# Patient Record
Sex: Male | Born: 1995 | Race: White | Hispanic: No | Marital: Single | Smoking: Current every day smoker
Health system: Southern US, Community
[De-identification: ages and names within clinical notes are randomized; demographics above are authoritative.]

## PROBLEM LIST (undated history)

## (undated) DIAGNOSIS — F1910 Other psychoactive substance abuse, uncomplicated: Secondary | ICD-10-CM

## (undated) DIAGNOSIS — I10 Essential (primary) hypertension: Secondary | ICD-10-CM

## (undated) DIAGNOSIS — J3489 Other specified disorders of nose and nasal sinuses: Secondary | ICD-10-CM

## (undated) DIAGNOSIS — F909 Attention-deficit hyperactivity disorder, unspecified type: Secondary | ICD-10-CM

## (undated) HISTORY — DX: Essential (primary) hypertension: I10

## (undated) HISTORY — DX: Attention-deficit hyperactivity disorder, unspecified type: F90.9

## (undated) HISTORY — DX: Other specified disorders of nose and nasal sinuses: J34.89

## (undated) HISTORY — DX: Other psychoactive substance abuse, uncomplicated: F19.10

---
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
## 2018-01-30 ENCOUNTER — Encounter (HOSPITAL_COMMUNITY): Payer: Self-pay | Admitting: Emergency Medicine

## 2018-01-30 ENCOUNTER — Emergency Department (HOSPITAL_COMMUNITY)
Admission: EM | Admit: 2018-01-30 | Discharge: 2018-01-30 | Disposition: A | Discharge status: Home / Self Care | Payer: BLUE CROSS/BLUE SHIELD | Attending: Emergency Medicine | Admitting: Emergency Medicine

## 2018-01-30 ENCOUNTER — Other Ambulatory Visit: Payer: Self-pay

## 2018-01-30 DIAGNOSIS — R202 Paresthesia of skin: Secondary | ICD-10-CM | POA: Insufficient documentation

## 2018-01-30 DIAGNOSIS — Z5321 Procedure and treatment not carried out due to patient leaving prior to being seen by health care provider: Secondary | ICD-10-CM | POA: Insufficient documentation

## 2018-01-30 NOTE — ED Notes (Signed)
Pt has come out of his room saying,"I know what's wrong, I have heat stroke" also said he is feeling better and no longer needs to be here and said he'd like to go.

## 2018-01-30 NOTE — ED Triage Notes (Signed)
Pt reports that several days ago he was drinking alcohol and the next day mowed the grass and then felt his fingers tingling and wanted to be check out. Pt alert and oriented x 4 and reports to drinking fluids without difficulty.

## 2018-04-28 ENCOUNTER — Emergency Department (HOSPITAL_COMMUNITY)
Admission: EM | Admit: 2018-04-28 | Discharge: 2018-04-28 | Disposition: A | Discharge status: Home / Self Care | Payer: BLUE CROSS/BLUE SHIELD | Attending: Emergency Medicine | Admitting: Emergency Medicine

## 2018-04-28 ENCOUNTER — Encounter (HOSPITAL_COMMUNITY): Payer: Self-pay | Admitting: *Deleted

## 2018-04-28 ENCOUNTER — Other Ambulatory Visit: Payer: Self-pay

## 2018-04-28 DIAGNOSIS — Y929 Unspecified place or not applicable: Secondary | ICD-10-CM | POA: Diagnosis not present

## 2018-04-28 DIAGNOSIS — Y999 Unspecified external cause status: Secondary | ICD-10-CM | POA: Insufficient documentation

## 2018-04-28 DIAGNOSIS — W25XXXA Contact with sharp glass, initial encounter: Secondary | ICD-10-CM | POA: Insufficient documentation

## 2018-04-28 DIAGNOSIS — S61511A Laceration without foreign body of right wrist, initial encounter: Secondary | ICD-10-CM | POA: Diagnosis not present

## 2018-04-28 DIAGNOSIS — Y939 Activity, unspecified: Secondary | ICD-10-CM | POA: Insufficient documentation

## 2018-04-28 MED ORDER — LIDOCAINE-EPINEPHRINE (PF) 2 %-1:200000 IJ SOLN
INTRAMUSCULAR | Status: AC
  Filled 2018-04-28: qty 20

## 2018-04-28 MED ORDER — LIDOCAINE-EPINEPHRINE (PF) 2 %-1:200000 IJ SOLN
20.0000 mL | Freq: Once | INTRAMUSCULAR | Status: AC
  Administered 2018-04-28: 20 mL

## 2018-04-28 NOTE — ED Triage Notes (Signed)
Pt reports he was trying to cut into a mason jar and the [employer]ason jar broke. Laceration to the anterior right wrist. Bleeding controlled in triage. Pt able to move wrist without difficulty

## 2018-04-28 NOTE — ED Provider Notes (Signed)
[facility]-EMERGENCY DEPT Provider Note   CSN: [id number] Arrival date & time: 04/28/18  0211     History   Chief Complaint Chief Complaint  Patient presents with  . Laceration    HPI Kyle Webb is a 22 y.o. male.  Patient presents to the emergency department with a chief complaint of right wrist laceration.  He states that he was trying to cut a mason jar and it broke and cut his wrist.  He denies any numbness or tingling in his fingertips.  Denies any weakness.  Last tetanus shot was 3 years ago.  He has not taken anything for his symptoms.  The history is provided by the patient. No language interpreter was used.    Past Medical History:  Diagnosis Date  . ADHD (attention deficit hyperactivity disorder)   . Sinus pressure     Patient Active Problem List   Diagnosis Date Noted  . ADHD (attention deficit hyperactivity disorder) 06/19/2012    History reviewed. No pertinent surgical history.      Home Medications    Prior to Admission medications   Medication Sig Start Date End Date Taking? Authorizing Provider  amphetamine-dextroamphetamine (ADDERALL XR) 30 MG 24 hr capsule Take 1 capsule (30 mg total) by mouth daily with breakfast. Patient not taking: Reported on 12/13/2016 03/14/15   Georgiann Hahnamgoolam, Andres, MD    Family History Family History  Problem Relation Age of Onset  . Hypertension Maternal Grandfather   . Hypertension Paternal Grandfather   . Alcohol abuse Neg Hx   . Arthritis Neg Hx   . Asthma Neg Hx   . Birth defects Neg Hx   . Cancer Neg Hx   . COPD Neg Hx   . Depression Neg Hx   . Diabetes Neg Hx   . Drug abuse Neg Hx   . Early death Neg Hx   . Hearing loss Neg Hx   . Heart disease Neg Hx   . Hyperlipidemia Neg Hx   . Kidney disease Neg Hx   . Learning disabilities Neg Hx   . Mental illness Neg Hx   . Mental retardation Neg Hx   . Miscarriages / Stillbirths Neg Hx   . Stroke Neg Hx   . Vision loss Neg Hx   .  Varicose Veins Neg Hx     Social History Social History   Tobacco Use  . Smoking status: Current Some Day Smoker  . Smokeless tobacco: Never Used  Substance Use Topics  . Alcohol use: Yes    Alcohol/week: 0.0 standard drinks    Comment: 12 pack of beer  . Drug use: Yes    Types: Marijuana     Allergies   Patient has no known allergies.   Review of Systems Review of Systems  All other systems reviewed and are negative.    Physical Exam Updated Vital Signs BP 132/80 (BP Location: Left Arm)   Pulse 93   Temp 98 F (36.7 C) (Oral)   Resp 18   Ht 5\' 8"  (1.727 m)   Wt 76.2 kg   SpO2 98%   BMI 25.54 kg/m   Physical Exam  Constitutional: He is oriented to person, place, and time. No distress.  HENT:  Head: Normocephalic and atraumatic.  Eyes: Pupils are equal, round, and reactive to light. Conjunctivae and EOM are normal.  Neck: No tracheal deviation present.  Cardiovascular: Normal rate.  Pulmonary/Chest: Effort normal. No respiratory distress.  Abdominal: Soft.  Musculoskeletal: Normal range  of motion.  Resisted flexion of all fingers isolated at all joints is 5/5 Resisted flexion of wrist is 5/5  Neurological: He is alert and oriented to person, place, and time.  Skin: Skin is warm and dry. He is not diaphoretic.  2.5 cm laceration to right wrist anteriorly, no foreign body  Psychiatric: Judgment normal.  Nursing note and vitals reviewed.    ED Treatments / Results  Labs (all labs ordered are listed, but only abnormal results are displayed) Labs Reviewed - No data to display  EKG None  Radiology No results found.  Procedures Procedures (including critical care time) LACERATION REPAIR Performed by: Roxy Horsemanobert Davione Lenker Authorized by: Roxy Horsemanobert Caelynn Marshman Consent: Verbal consent obtained. Risks and benefits: risks, benefits and alternatives were discussed Consent given by: patient Patient identity confirmed: provided demographic data Prepped and Draped  in normal sterile fashion Wound explored  Laceration Location: right volar wrist  Laceration Length: 2.5cm  No Foreign Bodies seen or palpated  Anesthesia: local infiltration  Local anesthetic: lidocaine 1% with epinephrine  Anesthetic total: 3 ml  Irrigation method: syringe Amount of cleaning: standard  Skin closure: 4-0 prolene  Number of sutures: 6  Technique: running  Patient tolerance: Patient tolerated the procedure well with no immediate complications.  Medications Ordered in ED Medications  lidocaine-EPINEPHrine (XYLOCAINE W/EPI) 2 %-1:200000 (PF) injection (has no administration in time range)  lidocaine-EPINEPHrine (XYLOCAINE W/EPI) 2 %-1:200000 (PF) injection 20 mL (20 mLs Infiltration Given 04/28/18 0403)     Initial Impression / Assessment and Plan / ED Course  I have reviewed the triage vital signs and the nursing notes.  Pertinent labs & imaging results that were available during my care of the patient were reviewed by me and considered in my medical decision making (see chart for details).     Patient with right wrist laceration.  No obvious tendon or ligament involvement.  Last tetanus shot was 3 years ago.  No foreign body.  Repaired in the ED without complication.  Sensation and strength 5/5 following repair.  Final Clinical Impressions(s) / ED Diagnoses   Final diagnoses:  Laceration of right wrist, initial encounter    ED Discharge Orders    None       Roxy HorsemanBrowning, Hideko Esselman, PA-C 04/28/18 0416    Nira Connardama, Pedro Eduardo, MD 04/28/18 [phone]

## 2018-09-23 ENCOUNTER — Encounter (HOSPITAL_COMMUNITY): Payer: Self-pay | Admitting: Emergency Medicine

## 2018-09-23 ENCOUNTER — Other Ambulatory Visit: Payer: Self-pay

## 2018-09-23 ENCOUNTER — Emergency Department (HOSPITAL_COMMUNITY)
Admission: EM | Admit: 2018-09-23 | Discharge: 2018-09-24 | Disposition: A | Discharge status: Home / Self Care | Payer: BLUE CROSS/BLUE SHIELD | Attending: Emergency Medicine | Admitting: Emergency Medicine

## 2018-09-23 DIAGNOSIS — S71101A Unspecified open wound, right thigh, initial encounter: Secondary | ICD-10-CM | POA: Diagnosis present

## 2018-09-23 DIAGNOSIS — W3400XA Accidental discharge from unspecified firearms or gun, initial encounter: Secondary | ICD-10-CM | POA: Insufficient documentation

## 2018-09-23 DIAGNOSIS — Z23 Encounter for immunization: Secondary | ICD-10-CM | POA: Diagnosis not present

## 2018-09-23 DIAGNOSIS — F17200 Nicotine dependence, unspecified, uncomplicated: Secondary | ICD-10-CM | POA: Diagnosis not present

## 2018-09-23 DIAGNOSIS — E876 Hypokalemia: Secondary | ICD-10-CM

## 2018-09-23 DIAGNOSIS — Y929 Unspecified place or not applicable: Secondary | ICD-10-CM | POA: Insufficient documentation

## 2018-09-23 DIAGNOSIS — F909 Attention-deficit hyperactivity disorder, unspecified type: Secondary | ICD-10-CM | POA: Diagnosis not present

## 2018-09-23 DIAGNOSIS — Y9389 Activity, other specified: Secondary | ICD-10-CM | POA: Diagnosis not present

## 2018-09-23 DIAGNOSIS — Y998 Other external cause status: Secondary | ICD-10-CM | POA: Insufficient documentation

## 2018-09-23 DIAGNOSIS — S71141A Puncture wound with foreign body, right thigh, initial encounter: Secondary | ICD-10-CM | POA: Insufficient documentation

## 2018-09-23 MED ORDER — ONDANSETRON HCL 4 MG/2ML IJ SOLN
4.0000 mg | INTRAMUSCULAR | Status: AC
Start: 2018-09-24 — End: 2018-09-24
  Administered 2018-09-24: 4 mg via INTRAVENOUS
  Filled 2018-09-23: qty 2

## 2018-09-23 MED ORDER — FENTANYL CITRATE (PF) 100 MCG/2ML IJ SOLN
50.0000 ug | INTRAMUSCULAR | Status: DC | PRN
  Administered 2018-09-24: 50 ug via INTRAVENOUS
  Filled 2018-09-23: qty 2

## 2018-09-23 MED ORDER — SODIUM CHLORIDE 0.9 % IV BOLUS
1000.0000 mL | Freq: Once | INTRAVENOUS | Status: AC
  Administered 2018-09-24: 1000 mL via INTRAVENOUS

## 2018-09-23 MED ORDER — CEFAZOLIN SODIUM-DEXTROSE 2-4 GM/100ML-% IV SOLN
2.0000 g | Freq: Once | INTRAVENOUS | Status: AC
  Administered 2018-09-24: 2 g via INTRAVENOUS
  Filled 2018-09-23: qty 100

## 2018-09-23 MED ORDER — TETANUS-DIPHTH-ACELL PERTUSSIS 5-2.5-18.5 LF-MCG/0.5 IM SUSP
0.5000 mL | Freq: Once | INTRAMUSCULAR | Status: AC
  Administered 2018-09-24: 0.5 mL via INTRAMUSCULAR
  Filled 2018-09-23: qty 0.5
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
## 2018-09-24 ENCOUNTER — Emergency Department (HOSPITAL_COMMUNITY): Payer: BLUE CROSS/BLUE SHIELD

## 2018-09-24 ENCOUNTER — Encounter (HOSPITAL_COMMUNITY): Payer: Self-pay

## 2018-09-24 LAB — I-STAT CHEM 8, ED
BUN: 12 mg/dL (ref 6–20)
CALCIUM ION: 1.17 mmol/L (ref 1.15–1.40)
Chloride: 102 mmol/L (ref 98–111)
Creatinine, Ser: 0.9 mg/dL (ref 0.61–1.24)
GLUCOSE: 102 mg/dL — AB (ref 70–99)
HCT: 43 % (ref 39.0–52.0)
HEMOGLOBIN: 14.6 g/dL (ref 13.0–17.0)
Potassium: 2.8 mmol/L — ABNORMAL LOW (ref 3.5–5.1)
Sodium: 140 mmol/L (ref 135–145)
TCO2: 22 mmol/L (ref 22–32)

## 2018-09-24 LAB — COMPREHENSIVE METABOLIC PANEL
ALBUMIN: 4.8 g/dL (ref 3.5–5.0)
ALK PHOS: 42 U/L (ref 38–126)
ALT: 20 U/L (ref 0–44)
ANION GAP: 13 (ref 5–15)
AST: 32 U/L (ref 15–41)
BUN: 14 mg/dL (ref 6–20)
CALCIUM: 9.5 mg/dL (ref 8.9–10.3)
CO2: 21 mmol/L — AB (ref 22–32)
Chloride: 105 mmol/L (ref 98–111)
Creatinine, Ser: 0.91 mg/dL (ref 0.61–1.24)
GFR calc Af Amer: >60 mL/min (ref >60)
GFR calc non Af Amer: >60 mL/min (ref >60)
GLUCOSE: 106 mg/dL — AB (ref 70–99)
Potassium: 2.8 mmol/L — ABNORMAL LOW (ref 3.5–5.1)
SODIUM: 139 mmol/L (ref 135–145)
Total Bilirubin: 0.6 mg/dL (ref 0.3–1.2)
Total Protein: 7.9 g/dL (ref 6.5–8.1)

## 2018-09-24 LAB — CBC
HCT: 40.6 % (ref 39.0–52.0)
Hemoglobin: 13.8 g/dL (ref 13.0–17.0)
MCH: 32.4 pg (ref 26.0–34.0)
MCHC: 34 g/dL (ref 30.0–36.0)
MCV: 95.3 fL (ref 80.0–100.0)
PLATELETS: 369 10*3/uL (ref 150–400)
RBC: 4.26 MIL/uL (ref 4.22–5.81)
RDW: 11.9 % (ref 11.5–15.5)
WBC: 12 10*3/uL — ABNORMAL HIGH (ref 4.0–10.5)
nRBC: 0 % (ref 0.0–0.2)

## 2018-09-24 LAB — TYPE AND SCREEN
ABO/RH(D): AB POS
ANTIBODY SCREEN: NEGATIVE

## 2018-09-24 LAB — ABO/RH: ABO/RH(D): AB POS

## 2018-09-24 MED ORDER — IOPAMIDOL (ISOVUE-370) INJECTION 76%
100.0000 mL | Freq: Once | INTRAVENOUS | Status: AC | PRN
  Administered 2018-09-24: 100 mL via INTRAVENOUS

## 2018-09-24 MED ORDER — IOPAMIDOL (ISOVUE-370) INJECTION 76%
INTRAVENOUS | Status: AC
  Administered 2018-09-24: 100 mL via INTRAVENOUS
  Filled 2018-09-24: qty 100

## 2018-09-24 MED ORDER — POTASSIUM CHLORIDE CRYS ER 20 MEQ PO TBCR: 20.0000 meq | EXTENDED_RELEASE_TABLET | Freq: Two times a day (BID) | ORAL | 0 refills | Status: DC

## 2018-09-24 MED ORDER — CEPHALEXIN 500 MG PO CAPS: ORAL_CAPSULE | ORAL | 0 refills | Status: DC

## 2018-09-24 MED ORDER — NAPROXEN 375 MG PO TABS: 375.0000 mg | ORAL_TABLET | Freq: Two times a day (BID) | ORAL | 0 refills | Status: DC

## 2018-09-24 MED ORDER — SODIUM CHLORIDE (PF) 0.9 % IJ SOLN
INTRAMUSCULAR | Status: AC
  Filled 2018-09-24: qty 50

## 2018-09-24 MED ORDER — POTASSIUM CHLORIDE CRYS ER 20 MEQ PO TBCR
40.0000 meq | EXTENDED_RELEASE_TABLET | Freq: Once | ORAL | Status: AC
  Administered 2018-09-24: 40 meq via ORAL
  Filled 2018-09-24: qty 2

## 2018-09-24 MED ORDER — TRAMADOL HCL 50 MG PO TABS: 50.0000 mg | ORAL_TABLET | Freq: Four times a day (QID) | ORAL | 0 refills | Status: DC | PRN

## 2018-09-24 NOTE — Discharge Instructions (Signed)
Get help right away if: °· You have shortness of breath. °· You have severe pain in your chest or abdomen. °· You faint or feel as if you may faint. °· You have bleeding that is hard to stop or control. °· You have chills. °· You have nausea or vomiting. °· You have numbness or weakness in the injured area. This may be a sign of damage to a nerve or tendon near the wound. ° °

## 2018-09-24 NOTE — ED Notes (Signed)
Dressing applied to right upper thigh. Knee immobilizer applied per pa.

## 2018-09-24 NOTE — ED Notes (Signed)
Notified EDP,Molpus,MD. Pt. I-stat Chem 8 results potassium 2.8 and PA,Abigail made aware.

## 2018-11-22 ENCOUNTER — Emergency Department (HOSPITAL_COMMUNITY)
Admission: EM | Admit: 2018-11-22 | Discharge: 2018-11-22 | Disposition: A | Discharge status: Home / Self Care | Payer: BLUE CROSS/BLUE SHIELD | Attending: Emergency Medicine | Admitting: Emergency Medicine

## 2018-11-22 ENCOUNTER — Emergency Department (HOSPITAL_COMMUNITY): Payer: BLUE CROSS/BLUE SHIELD

## 2018-11-22 ENCOUNTER — Other Ambulatory Visit: Payer: Self-pay

## 2018-11-22 ENCOUNTER — Encounter (HOSPITAL_COMMUNITY): Payer: Self-pay

## 2018-11-22 DIAGNOSIS — R0609 Other forms of dyspnea: Secondary | ICD-10-CM | POA: Insufficient documentation

## 2018-11-22 DIAGNOSIS — F17210 Nicotine dependence, cigarettes, uncomplicated: Secondary | ICD-10-CM | POA: Insufficient documentation

## 2018-11-22 DIAGNOSIS — Z79899 Other long term (current) drug therapy: Secondary | ICD-10-CM | POA: Insufficient documentation

## 2018-11-22 DIAGNOSIS — R002 Palpitations: Secondary | ICD-10-CM | POA: Diagnosis not present

## 2018-11-22 DIAGNOSIS — F419 Anxiety disorder, unspecified: Secondary | ICD-10-CM | POA: Diagnosis present

## 2018-11-22 LAB — BASIC METABOLIC PANEL
Anion gap: 9 (ref 5–15)
BUN: 7 mg/dL (ref 6–20)
CO2: 25 mmol/L (ref 22–32)
Calcium: 9.7 mg/dL (ref 8.9–10.3)
Chloride: 103 mmol/L (ref 98–111)
Creatinine, Ser: 0.69 mg/dL (ref 0.61–1.24)
GFR calc Af Amer: >60 mL/min (ref >60)
GFR calc non Af Amer: >60 mL/min (ref >60)
Glucose, Bld: 116 mg/dL — ABNORMAL HIGH (ref 70–99)
Potassium: 4.1 mmol/L (ref 3.5–5.1)
Sodium: 137 mmol/L (ref 135–145)

## 2018-11-22 LAB — URINALYSIS, ROUTINE W REFLEX MICROSCOPIC
BILIRUBIN URINE: NEGATIVE
Glucose, UA: NEGATIVE mg/dL
Hgb urine dipstick: NEGATIVE
Ketones, ur: NEGATIVE mg/dL
Leukocytes,Ua: NEGATIVE
NITRITE: NEGATIVE
Protein, ur: NEGATIVE mg/dL
Specific Gravity, Urine: 1.001 — ABNORMAL LOW (ref 1.005–1.030)
pH: 7 (ref 5.0–8.0)

## 2018-11-22 LAB — RAPID URINE DRUG SCREEN, HOSP PERFORMED
Amphetamines: NOT DETECTED
BARBITURATES: NOT DETECTED
Benzodiazepines: NOT DETECTED
Cocaine: NOT DETECTED
Opiates: NOT DETECTED
Tetrahydrocannabinol: NOT DETECTED

## 2018-11-22 LAB — ETHANOL: Alcohol, Ethyl (B): <10 mg/dL (ref <10)

## 2018-11-22 LAB — BRAIN NATRIURETIC PEPTIDE: B Natriuretic Peptide: 10.2 pg/mL (ref 0.0–100.0)

## 2018-11-22 MED ORDER — LORAZEPAM 2 MG/ML IJ SOLN
1.0000 mg | Freq: Once | INTRAMUSCULAR | Status: DC
  Filled 2018-11-22: qty 1

## 2018-11-22 MED ORDER — SODIUM CHLORIDE 0.9 % IV BOLUS (SEPSIS)
1000.0000 mL | Freq: Once | INTRAVENOUS | Status: AC
  Administered 2018-11-22: 1000 mL via INTRAVENOUS
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
## 2018-11-22 NOTE — ED Provider Notes (Signed)
[facility]-EMERGENCY DEPT Provider Note   CSN: [id number] Arrival date & time: 11/22/18  8657    History   Chief Complaint Chief Complaint  Patient presents with  . Anxiety  . Tachycardia    HPI Kyle Webb is a 23 y.o. male.     Patient is a 23 year old male with past medical history of anxiety, ADHD who presents the emergency department for palpitations and chest pain.  Patient reports that last night he was drinking beer and that he left his open be around friends and they poured "a large amount" of salt water into his beer and he drank all the salt water.  Reports that he ingested too much sodium and immediately got jaundice and his belly felt full and he got palpitations.  Reports that "I must have drank too much salt because my pee is clear".  Reports that he has still had some palpitations, they have improved since yesterday.  Reports that he is feeling some sharpness in the left side of his chest as well and he did have some difficulty breathing but not currently.  He does not see a primary care doctor or get treatment for his anxiety.  He denies any cough, fever, chills, nausea, vomiting.  Reports that he stopped smoking marijuana about a week ago.  Reports that he does smoke a cigarette.  Reports that he drinks about 9 beers at a time on about 5 days out of the week.  His last drink was around midnight last night.  He denies any other drug use. Denies SI,HI , hallucinations     Past Medical History:  Diagnosis Date  . ADHD (attention deficit hyperactivity disorder)   . Sinus pressure     Patient Active Problem List   Diagnosis Date Noted  . ADHD (attention deficit hyperactivity disorder) 06/19/2012    History reviewed. No pertinent surgical history.      Home Medications    Prior to Admission medications   Medication Sig Start Date End Date Taking? Authorizing Provider  amphetamine-dextroamphetamine (ADDERALL XR) 30 MG 24 hr capsule  Take 1 capsule (30 mg total) by mouth daily with breakfast. Patient not taking: Reported on 12/13/2016 03/14/15   Georgiann Hahn, MD  cephALEXin (KEFLEX) 500 MG capsule 2 caps po bid x 7 days Patient not taking: Reported on 11/22/2018 09/24/18   Arthor Captain, PA-C  naproxen (NAPROSYN) 375 MG tablet Take 1 tablet (375 mg total) by mouth 2 (two) times daily. Patient not taking: Reported on 11/22/2018 09/24/18   Arthor Captain, PA-C  potassium chloride SA (K-DUR,KLOR-CON) 20 MEQ tablet Take 1 tablet (20 mEq total) by mouth 2 (two) times daily. For 3 days Patient not taking: Reported on 11/22/2018 09/24/18   Arthor Captain, PA-C  traMADol (ULTRAM) 50 MG tablet Take 1 tablet (50 mg total) by mouth every 6 (six) hours as needed. Patient not taking: Reported on 11/22/2018 09/24/18   Arthor Captain, PA-C    Family History Family History  Problem Relation Age of Onset  . Hypertension Maternal Grandfather   . Hypertension Paternal Grandfather   . Alcohol abuse Neg Hx   . Arthritis Neg Hx   . Asthma Neg Hx   . Birth defects Neg Hx   . Cancer Neg Hx   . COPD Neg Hx   . Depression Neg Hx   . Diabetes Neg Hx   . Drug abuse Neg Hx   . Early death Neg Hx   . Hearing loss Neg Hx   .  Heart disease Neg Hx   . Hyperlipidemia Neg Hx   . Kidney disease Neg Hx   . Learning disabilities Neg Hx   . Mental illness Neg Hx   . Mental retardation Neg Hx   . Miscarriages / Stillbirths Neg Hx   . Stroke Neg Hx   . Vision loss Neg Hx   . Varicose Veins Neg Hx     Social History Social History   Tobacco Use  . Smoking status: Current Every Day Smoker    Types: Cigarettes  . Smokeless tobacco: Never Used  Substance Use Topics  . Alcohol use: Yes    Alcohol/week: 0.0 standard drinks    Comment: daily x 2-3 weeks  . Drug use: Not Currently    Types: Marijuana    Comment: none x 1 week     Allergies   Patient has no known allergies.   Review of Systems Review of Systems  Constitutional:  Negative for chills and fever.  HENT: Negative for congestion, ear pain, rhinorrhea and sore throat.   Eyes: Negative for pain and visual disturbance.  Respiratory: Positive for shortness of breath. Negative for cough.   Cardiovascular: Positive for chest pain and palpitations.  Gastrointestinal: Positive for abdominal distention. Negative for abdominal pain, constipation, diarrhea, nausea and vomiting.  Genitourinary: Negative for decreased urine volume, difficulty urinating, dysuria, flank pain, frequency, hematuria, penile pain and urgency.  Musculoskeletal: Negative for arthralgias and back pain.  Skin: Negative for color change and rash.  Neurological: Negative for dizziness, seizures, syncope and headaches.  All other systems reviewed and are negative.    Physical Exam Updated Vital Signs BP (!) 141/83   Pulse 83   Temp 98.4 F (36.9 C) (Oral)   Resp 15   Ht 5\' 7"  (1.702 m)   Wt 74.8 kg   SpO2 100%   BMI 25.84 kg/m   Physical Exam Vitals signs and nursing note reviewed.  Constitutional:      General: He is not in acute distress.    Appearance: Normal appearance. He is well-developed. He is not ill-appearing, toxic-appearing or diaphoretic.  HENT:     Head: Normocephalic and atraumatic.     Right Ear: Tympanic membrane normal.     Left Ear: Tympanic membrane normal.     Nose: Nose normal.     Mouth/Throat:     Mouth: Mucous membranes are moist.     Pharynx: Oropharynx is clear. No oropharyngeal exudate or posterior oropharyngeal erythema.  Eyes:     Conjunctiva/sclera: Conjunctivae normal.  Neck:     Musculoskeletal: Neck supple.  Cardiovascular:     Rate and Rhythm: Regular rhythm. Tachycardia present.     Heart sounds: No murmur.  Pulmonary:     Effort: Pulmonary effort is normal. No respiratory distress.     Breath sounds: Normal breath sounds.  Abdominal:     Palpations: Abdomen is soft.     Tenderness: There is no abdominal tenderness.  Skin:     General: Skin is warm and dry.     Capillary Refill: Capillary refill takes less than 2 seconds.  Neurological:     Mental Status: He is alert.  Psychiatric:        Mood and Affect: Mood normal.     Comments: Appears anxious      ED Treatments / Results  Labs (all labs ordered are listed, but only abnormal results are displayed) Labs Reviewed  BASIC METABOLIC PANEL - Abnormal; Notable for the following components:  Result Value   Glucose, Bld 116 (*)    All other components within normal limits  URINALYSIS, ROUTINE W REFLEX MICROSCOPIC - Abnormal; Notable for the following components:   Color, Urine COLORLESS (*)    Specific Gravity, Urine 1.001 (*)    All other components within normal limits  BRAIN NATRIURETIC PEPTIDE  RAPID URINE DRUG SCREEN, HOSP PERFORMED  ETHANOL    EKG None  Radiology Dg Chest 2 View  Result Date: 11/22/2018 CLINICAL DATA:  Chest pain and cardiac palpitations EXAM: CHEST - 2 VIEW COMPARISON:  None. FINDINGS: The heart size and mediastinal contours are within normal limits. Both lungs are clear. The visualized skeletal structures are unremarkable. IMPRESSION: No active cardiopulmonary disease. Electronically Signed   By: Alcide Clever M.D.   On: 11/22/2018 09:01    Procedures Procedures (including critical care time)  Medications Ordered in ED Medications  LORazepam (ATIVAN) injection 1 mg (1 mg Intravenous Refused 11/22/18 0935)  sodium chloride 0.9 % bolus 1,000 mL (1,000 mLs Intravenous New Bag/Given 11/22/18 0916)     Initial Impression / Assessment and Plan / ED Course  I have reviewed the triage vital signs and the nursing notes.  Pertinent labs & imaging results that were available during my care of the patient were reviewed by me and considered in my medical decision making (see chart for details).        Based on review of vitals, medical screening exam, lab work and/or imaging, there does not appear to be an acute, emergent  etiology for the patient's symptoms. Counseled pt on good return precautions and encouraged both PCP and ED follow-up as needed.  Prior to discharge, I also discussed incidental imaging findings with patient in detail and advised appropriate, recommended follow-up in detail.  Clinical Impression: 1. Palpitations     Disposition: Discharge   This note was prepared with assistance of Dragon voice recognition software. Occasional wrong-word or sound-a-like substitutions may have occurred due to the inherent limitations of voice recognition software.   Final Clinical Impressions(s) / ED Diagnoses   Final diagnoses:  Palpitations    ED Discharge Orders    None       Jeral Pinch 11/22/18 1014    Derwood Kaplan, MD 11/24/18 1215

## 2018-11-22 NOTE — Discharge Instructions (Addendum)
Thank you for allowing me to care for you today. Please return to the emergency department if you have new or worsening symptoms. Take your medications as instructed.  ° °

## 2019-04-25 ENCOUNTER — Other Ambulatory Visit: Payer: Self-pay

## 2019-04-25 ENCOUNTER — Encounter (HOSPITAL_COMMUNITY): Payer: Self-pay | Admitting: Emergency Medicine

## 2019-04-25 ENCOUNTER — Ambulatory Visit (HOSPITAL_COMMUNITY)
Admission: EM | Admit: 2019-04-25 | Discharge: 2019-04-25 | Disposition: A | Discharge status: Home / Self Care | Payer: BC Managed Care – PPO | Attending: Family Medicine | Admitting: Family Medicine

## 2019-04-25 DIAGNOSIS — L03116 Cellulitis of left lower limb: Secondary | ICD-10-CM | POA: Diagnosis not present

## 2019-04-25 MED ORDER — DOXYCYCLINE HYCLATE 100 MG PO CAPS: 100.0000 mg | ORAL_CAPSULE | Freq: Two times a day (BID) | ORAL | 0 refills | Status: DC

## 2019-04-25 MED ORDER — CEFTRIAXONE SODIUM 1 G IJ SOLR
1.0000 g | Freq: Once | INTRAMUSCULAR | Status: AC
  Administered 2019-04-25: 1 g via INTRAMUSCULAR

## 2019-04-25 MED ORDER — CEFTRIAXONE SODIUM 1 G IJ SOLR
INTRAMUSCULAR | Status: AC
  Filled 2019-04-25: qty 10

## 2019-04-25 MED ORDER — LIDOCAINE HCL (PF) 1 % IJ SOLN
INTRAMUSCULAR | Status: AC
  Filled 2019-04-25: qty 2

## 2019-04-25 NOTE — Discharge Instructions (Signed)
Meds ordered this encounter  Medications   cefTRIAXone (ROCEPHIN) injection 1 g    

## 2019-04-25 NOTE — ED Provider Notes (Signed)
Kyle Webb   [id number] 04/25/19 Arrival Time: 0901  ASSESSMENT & PLAN:  1. Cellulitis of leg, left     No sign of abscess requiring I&D at this time. Discussed.  Meds ordered this encounter  Medications  . cefTRIAXone (ROCEPHIN) injection 1 g  . doxycycline (VIBRAMYCIN) 100 MG capsule    Sig: Take 1 capsule (100 mg total) by mouth 2 (two) times daily.    Dispense:  20 capsule    Refill:  0   Follow-up [facility].   Specialty: Urgent Care Why: If not improving over the next 348 hours. Contact information: [place] [phone]         OTC analgesics as needed. WBAT.  Reviewed expectations re: course of current medical issues. Questions answered. Outlined signs and symptoms indicating need for more acute intervention. Patient verbalized understanding. After Visit Summary given.  SUBJECTIVE: History from: patient. Kyle Webb is a 23 y.o. male who reports persistent moderate pain of his left anterior knee; described as aching without radiation. Onset: gradual, over the past week. Injury/trama: 'popped a bump then it started swelling and getting red and painful'. Symptoms have gradually worsened since beginning. Aggravating factors: movements of knee and weight bearing. Alleviating factors: rest. Associated symptoms: none reported. Extremity sensation changes or weakness: none. Self treatment: has not tried OTCs for relief of pain. History of similar: no. Afebrile. No drainage from left knee.  ROS: As per HPI. All other systems negative.    OBJECTIVE:  Vitals:   04/25/19 0948  BP: 118/77  Pulse: 87  Resp: 16  Temp: 98.7 F (37.1 C)  TempSrc: Oral  SpO2: 100%    General appearance: alert; no distress HEENT: [place]; AT Neck: supple with FROM Resp: unlabored respirations Extremities: . LLE: warm and well perfused; well localized moderate  tenderness over left anterior knee; without gross deformities; with moderate swelling and erythema; no fluctuance; with no bruising; ROM: normal with reported discomfort CV: brisk extremity capillary refill of LLE; 2+ DP pulse of LLE. Skin: warm and dry; no visible rashes Neurologic: gait normal; normal reflexes of LLE; normal sensation of LLE; normal strength of LLE Psychological: alert and cooperative; normal mood and affect   No Known Allergies  Past Medical History:  Diagnosis Date  . ADHD (attention deficit hyperactivity disorder)   . Sinus pressure    Social History   Socioeconomic History  . Marital status: Single    Spouse name: Not on file  . Number of children: Not on file  . Years of education: Not on file  . Highest education level: Not on file  Occupational History  . Not on file  Social Needs  . Financial resource strain: Not on file  . Food insecurity    Worry: Not on file    Inability: Not on file  . Transportation needs    Medical: Not on file    Non-medical: Not on file  Tobacco Use  . Smoking status: Current Every Day Smoker    Types: Cigarettes  . Smokeless tobacco: Never Used  Substance and Sexual Activity  . Alcohol use: Yes    Alcohol/week: 0.0 standard drinks    Comment: daily x 2-3 weeks  . Drug use: Not Currently    Types: Marijuana    Comment: none x 1 week  . Sexual activity: Never  Lifestyle  . Physical activity    Days per week:  Not on file    Minutes per session: Not on file  . Stress: Not on file  Relationships  . Social Musicianconnections    Talks on phone: Not on file    Gets together: Not on file    Attends religious service: Not on file    Active member of club or organization: Not on file    Attends meetings of clubs or organizations: Not on file    Relationship status: Not on file  Other Topics Concern  . Not on file  Social History Narrative  . Not on file   Family History  Problem Relation Age of Onset  . Hypertension  Maternal Grandfather   . Hypertension Paternal Grandfather   . Alcohol abuse Neg Hx   . Arthritis Neg Hx   . Asthma Neg Hx   . Birth defects Neg Hx   . Cancer Neg Hx   . COPD Neg Hx   . Depression Neg Hx   . Diabetes Neg Hx   . Drug abuse Neg Hx   . Early death Neg Hx   . Hearing loss Neg Hx   . Heart disease Neg Hx   . Hyperlipidemia Neg Hx   . Kidney disease Neg Hx   . Learning disabilities Neg Hx   . Mental illness Neg Hx   . Mental retardation Neg Hx   . Miscarriages / Stillbirths Neg Hx   . Stroke Neg Hx   . Vision loss Neg Hx   . Varicose Veins Neg Hx    No past surgical history on file.    Mardella LaymanHagler, Kyle Kenley, MD 04/25/19 1002

## 2019-04-25 NOTE — ED Triage Notes (Addendum)
Patient reports that 2 weeks ago had a bump on right knee.  Patient " popped it".  Since then, has become painful and swelling has occurred.  There is a small scab / area to left knee, thought to be where bump was.  Knee is red, swollen and hot to touch

## 2020-09-17 DIAGNOSIS — Z31440 Encounter of male for testing for genetic disease carrier status for procreative management: Secondary | ICD-10-CM | POA: Diagnosis not present

## 2020-10-10 DIAGNOSIS — F411 Generalized anxiety disorder: Secondary | ICD-10-CM | POA: Diagnosis not present

## 2020-10-10 DIAGNOSIS — F1019 Alcohol abuse with unspecified alcohol-induced disorder: Secondary | ICD-10-CM | POA: Diagnosis not present

## 2020-10-12 DIAGNOSIS — F411 Generalized anxiety disorder: Secondary | ICD-10-CM | POA: Diagnosis not present

## 2020-10-12 DIAGNOSIS — F1019 Alcohol abuse with unspecified alcohol-induced disorder: Secondary | ICD-10-CM | POA: Diagnosis not present

## 2020-11-24 DIAGNOSIS — F4320 Adjustment disorder, unspecified: Secondary | ICD-10-CM | POA: Diagnosis not present

## 2020-11-24 DIAGNOSIS — R6889 Other general symptoms and signs: Secondary | ICD-10-CM | POA: Diagnosis not present

## 2020-11-24 DIAGNOSIS — F1220 Cannabis dependence, uncomplicated: Secondary | ICD-10-CM | POA: Diagnosis not present

## 2020-11-24 DIAGNOSIS — R946 Abnormal results of thyroid function studies: Secondary | ICD-10-CM | POA: Diagnosis not present

## 2020-11-24 DIAGNOSIS — F1020 Alcohol dependence, uncomplicated: Secondary | ICD-10-CM | POA: Diagnosis not present

## 2020-11-24 DIAGNOSIS — R748 Abnormal levels of other serum enzymes: Secondary | ICD-10-CM | POA: Diagnosis not present

## 2020-11-24 DIAGNOSIS — R7989 Other specified abnormal findings of blood chemistry: Secondary | ICD-10-CM | POA: Diagnosis not present

## 2020-12-22 ENCOUNTER — Emergency Department (HOSPITAL_COMMUNITY)
Admission: EM | Admit: 2020-12-22 | Discharge: 2020-12-22 | Disposition: A | Discharge status: Home / Self Care | Payer: BC Managed Care – PPO | Attending: Emergency Medicine | Admitting: Emergency Medicine

## 2020-12-22 ENCOUNTER — Other Ambulatory Visit: Payer: Self-pay

## 2020-12-22 ENCOUNTER — Encounter (HOSPITAL_COMMUNITY): Payer: Self-pay

## 2020-12-22 ENCOUNTER — Emergency Department (HOSPITAL_COMMUNITY): Payer: BC Managed Care – PPO

## 2020-12-22 DIAGNOSIS — R109 Unspecified abdominal pain: Secondary | ICD-10-CM | POA: Diagnosis not present

## 2020-12-22 DIAGNOSIS — F17210 Nicotine dependence, cigarettes, uncomplicated: Secondary | ICD-10-CM | POA: Insufficient documentation

## 2020-12-22 DIAGNOSIS — R1030 Lower abdominal pain, unspecified: Secondary | ICD-10-CM | POA: Diagnosis not present

## 2020-12-22 LAB — CBC
HCT: 39.3 % (ref 39.0–52.0)
Hemoglobin: 14 g/dL (ref 13.0–17.0)
MCH: 32.4 pg (ref 26.0–34.0)
MCHC: 35.6 g/dL (ref 30.0–36.0)
MCV: 91 fL (ref 80.0–100.0)
Platelets: 360 10*3/uL (ref 150–400)
RBC: 4.32 MIL/uL (ref 4.22–5.81)
RDW: 11.3 % — ABNORMAL LOW (ref 11.5–15.5)
WBC: 7.6 10*3/uL (ref 4.0–10.5)
nRBC: 0 % (ref 0.0–0.2)

## 2020-12-22 LAB — URINALYSIS, ROUTINE W REFLEX MICROSCOPIC
Bilirubin Urine: NEGATIVE
Glucose, UA: NEGATIVE mg/dL
Hgb urine dipstick: NEGATIVE
Ketones, ur: NEGATIVE mg/dL
Leukocytes,Ua: NEGATIVE
Nitrite: NEGATIVE
Protein, ur: NEGATIVE mg/dL
Specific Gravity, Urine: 1.004 — ABNORMAL LOW (ref 1.005–1.030)
pH: 7 (ref 5.0–8.0)

## 2020-12-22 LAB — COMPREHENSIVE METABOLIC PANEL
ALT: 31 U/L (ref 0–44)
AST: 56 U/L — ABNORMAL HIGH (ref 15–41)
Albumin: 4.1 g/dL (ref 3.5–5.0)
Alkaline Phosphatase: 51 U/L (ref 38–126)
Anion gap: 12 (ref 5–15)
BUN: 7 mg/dL (ref 6–20)
CO2: 22 mmol/L (ref 22–32)
Calcium: 8.6 mg/dL — ABNORMAL LOW (ref 8.9–10.3)
Chloride: 106 mmol/L (ref 98–111)
Creatinine, Ser: 0.67 mg/dL (ref 0.61–1.24)
GFR, Estimated: >60 mL/min (ref >60)
Glucose, Bld: 115 mg/dL — ABNORMAL HIGH (ref 70–99)
Potassium: 3.8 mmol/L (ref 3.5–5.1)
Sodium: 140 mmol/L (ref 135–145)
Total Bilirubin: 0.7 mg/dL (ref 0.3–1.2)
Total Protein: 7.2 g/dL (ref 6.5–8.1)

## 2020-12-22 LAB — LIPASE, BLOOD: Lipase: 32 U/L (ref 11–51)

## 2020-12-22 MED ORDER — IOHEXOL 300 MG/ML  SOLN
100.0000 mL | Freq: Once | INTRAMUSCULAR | Status: AC | PRN
  Administered 2020-12-22: 100 mL via INTRAVENOUS

## 2020-12-22 NOTE — ED Provider Notes (Signed)
Patient signed out to me at shift change.  CT scan shows no acute abnormality.  No emergent explanation for the patient's pain is found.  He is nontoxic and well-appearing on my reassessment.  I feel that he is stable for discharge and outpatient follow-up.  Return precautions discussed.   Roxy Horseman, PA-C 12/22/20 2309    Terrilee Files, MD 12/23/20 [phone]

## 2020-12-22 NOTE — ED Provider Notes (Signed)
[facility]-EMERGENCY DEPT Provider Note   CSN: [id number] Arrival date & time: 12/22/20  1916     History Chief Complaint  Patient presents with  . Abdominal Pain    Kyle Webb is a 25 y.o. male with history of ADHD.  Patient presents with complaint of lower quadrant abdominal pain.  Patient reports that his pain yesterday morning at 0800 after he started drinking.  Patient reports pain has been constant since then.  Patient rates his pain 6/10 on pain scale.  Patient denies any radiation of pain.  Patient denies any alleviating or aggravating factors.  Patient denies any fevers, chills, chest pain, shortness of breath, nausea, vomiting, blood in stool, melena constipation, hematuria, dysuria, urinary frequency, genital swelling, genital tenderness, lightheadedness, dizziness, syncopal.    Patient endorses drinking #18 12ounce beers last 24 hours.  Patient reports he has been drinking for the last 3 days.  Patient reports he has previously rehab for his drinking.  Patient denies any suicidal ideations, homicidal ideation,  auditory hallucinations, visual hallucinations, tactile hallucinations, tremors.    HPI     Past Medical History:  Diagnosis Date  . ADHD (attention deficit hyperactivity disorder)   . Sinus pressure     Patient Active Problem List   Diagnosis Date Noted  . ADHD (attention deficit hyperactivity disorder) 06/19/2012    History reviewed. No pertinent surgical history.     Family History  Problem Relation Age of Onset  . Hypertension Maternal Grandfather   . Hypertension Paternal Grandfather   . Alcohol abuse Neg Hx   . Arthritis Neg Hx   . Asthma Neg Hx   . Birth defects Neg Hx   . Cancer Neg Hx   . COPD Neg Hx   . Depression Neg Hx   . Diabetes Neg Hx   . Drug abuse Neg Hx   . Early death Neg Hx   . Hearing loss Neg Hx   . Heart disease Neg Hx   . Hyperlipidemia Neg Hx   . Kidney disease Neg Hx   . Learning  disabilities Neg Hx   . Mental illness Neg Hx   . Mental retardation Neg Hx   . Miscarriages / Stillbirths Neg Hx   . Stroke Neg Hx   . Vision loss Neg Hx   . Varicose Veins Neg Hx     Social History   Tobacco Use  . Smoking status: Current Every Day Smoker    Types: Cigarettes  . Smokeless tobacco: Never Used  Vaping Use  . Vaping Use: Never used  Substance Use Topics  . Alcohol use: Yes    Alcohol/week: 0.0 standard drinks    Comment: daily x 2-3 weeks  . Drug use: Not Currently    Types: Marijuana    Comment: none x 1 week    Home Medications Prior to Admission medications   Medication Sig Start Date End Date Taking? Authorizing Provider  amphetamine-dextroamphetamine (ADDERALL XR) 30 MG 24 hr capsule Take 1 capsule (30 mg total) by mouth daily with breakfast. Patient not taking: Reported on 12/13/2016 03/14/15   Georgiann Hahn, MD  cephALEXin (KEFLEX) 500 MG capsule 2 caps po bid x 7 days Patient not taking: Reported on 11/22/2018 09/24/18   Arthor Captain, PA-C  doxycycline (VIBRAMYCIN) 100 MG capsule Take 1 capsule (100 mg total) by mouth 2 (two) times daily. 04/25/19   Mardella Layman, MD  naproxen (NAPROSYN) 375 MG tablet Take 1 tablet (375 mg total) by mouth  2 (two) times daily. Patient not taking: Reported on 11/22/2018 09/24/18   Arthor Captain, PA-C  potassium chloride SA (K-DUR,KLOR-CON) 20 MEQ tablet Take 1 tablet (20 mEq total) by mouth 2 (two) times daily. For 3 days Patient not taking: Reported on 11/22/2018 09/24/18   Arthor Captain, PA-C  traMADol (ULTRAM) 50 MG tablet Take 1 tablet (50 mg total) by mouth every 6 (six) hours as needed. Patient not taking: Reported on 11/22/2018 09/24/18   Arthor Captain, PA-C    Allergies    Patient has no known allergies.  Review of Systems   Review of Systems  Constitutional: Negative for chills and fever.  Eyes: Negative for visual disturbance.  Respiratory: Negative for shortness of breath.   Cardiovascular: Negative  for chest pain.  Gastrointestinal: Positive for abdominal pain and diarrhea. Negative for abdominal distention, anal bleeding, blood in stool, constipation, nausea, rectal pain and vomiting.  Genitourinary: Negative for decreased urine volume, difficulty urinating, dysuria, flank pain, frequency, genital sores, hematuria, penile discharge, penile pain, penile swelling, scrotal swelling, testicular pain and urgency.  Musculoskeletal: Negative for back pain and neck pain.  Skin: Negative for color change and rash.  Neurological: Negative for dizziness, syncope, light-headedness and headaches.  Psychiatric/Behavioral: Negative for confusion, hallucinations, self-injury and suicidal ideas.    Physical Exam Updated Vital Signs BP (!) 146/85 (BP Location: Right Arm)   Pulse 90   Temp 98.4 F (36.9 C) (Oral)   Resp 18   Ht 5\' 8"  (1.727 m)   Wt 75.8 kg   SpO2 94%   BMI 25.39 kg/m   Physical Exam Vitals and nursing note reviewed.  Constitutional:      General: He is not in acute distress.    Appearance: He is not ill-appearing, toxic-appearing or diaphoretic.  HENT:     Head: Normocephalic.  Eyes:     General: No scleral icterus.       Right eye: No discharge.        Left eye: No discharge.  Cardiovascular:     Rate and Rhythm: Normal rate.  Pulmonary:     Effort: Pulmonary effort is normal. No tachypnea, bradypnea or prolonged expiration.     Breath sounds: Normal breath sounds. No stridor.  Abdominal:     General: Abdomen is flat. Bowel sounds are normal. There is no distension. There are no signs of injury.     Palpations: Abdomen is soft. There is no mass or pulsatile mass.     Tenderness: There is abdominal tenderness in the right lower quadrant and left lower quadrant. There is no right CVA tenderness, left CVA tenderness, guarding or rebound. Negative signs include psoas sign.     Hernia: There is no hernia in the umbilical area or ventral area.  Musculoskeletal:      Cervical back: Neck supple.     Right lower leg: Normal.     Left lower leg: Normal.  Skin:    General: Skin is warm and dry.     Coloration: Skin is not cyanotic, jaundiced or pale.  Neurological:     General: No focal deficit present.     Mental Status: He is alert.  Psychiatric:        Behavior: Behavior is cooperative.     ED Results / Procedures / Treatments   Labs (all labs ordered are listed, but only abnormal results are displayed) Labs Reviewed  COMPREHENSIVE METABOLIC PANEL - Abnormal; Notable for the following components:      Result Value  Glucose, Bld 115 (*)    Calcium 8.6 (*)    AST 56 (*)    All other components within normal limits  CBC - Abnormal; Notable for the following components:   RDW 11.3 (*)    All other components within normal limits  URINALYSIS, ROUTINE W REFLEX MICROSCOPIC - Abnormal; Notable for the following components:   Color, Urine COLORLESS (*)    Specific Gravity, Urine 1.004 (*)    All other components within normal limits  LIPASE, BLOOD    EKG None  Radiology No results found.  Procedures Procedures   Medications Ordered in ED Medications - No data to display  ED Course  I have reviewed the triage vital signs and the nursing notes.  Pertinent labs & imaging results that were available during my care of the patient were reviewed by me and considered in my medical decision making (see chart for details).    MDM Rules/Calculators/A&P                          Alert 25 year old male no acute stress, nontoxic-appearing.  Patient presents with chief complaint of lower quadrant abdominal pain.  Patient reports his pain has been present since 0800 yesterday morning.  Patient reports increased consumption stating he drank 18 beers in the last 24 hours.    Patient reports that he has been drinking over the last 3 days.  Patient denies any suicidal ideations, homicidal ideations, auditory hallucinations, visual hallucinations,  tactile hallucinations.  Patient has no tremors.  Low suspicion for alcohol withdrawal.  Physical exam abdomen soft, nondistended, tenderness to right lower and left lower quadrant, no guarding or rebound tenderness.    CMP, lipase, urinalysis, CBC ordered while patient was in triage.  Lipase within normal limits low suspicion for acute pancreatitis. Urinalysis shows no signs of infection or dehydration. CBC is unremarkable. CMP shows AST slightly elevated at 56.    Due to patient's location of pain concern for possible diverticulitis or appendicitis.  Will obtain CT abdomen pelvis.  If CT abdomen and pelvis is unremarkable will discharge patient with resources for counseling and substance use.  Patient care transferred to [facility] at the end of my shift. Patient presentation, ED course, and plan of care discussed with review of all pertinent labs and imaging. Please see his/her note for further details regarding further ED course and disposition.  Final Clinical Impression(s) / ED Diagnoses Final diagnoses:  Lower abdominal pain    Rx / DC Orders ED Discharge Orders    None       Haskel Schroeder, PA-C 12/23/20 0040    Terrilee Files, MD 12/23/20 1000

## 2020-12-22 NOTE — ED Triage Notes (Signed)
Patient arrived stating that he is having mid abdominal pain that started yesterday after drinking beer. Declines any NV but has had some diarrhea. Declines any OTC medication prior to arrival.

## 2021-02-01 ENCOUNTER — Emergency Department (HOSPITAL_COMMUNITY)
Admission: EM | Admit: 2021-02-01 | Discharge: 2021-02-01 | Disposition: A | Discharge status: Home / Self Care | Payer: BC Managed Care – PPO | Attending: Emergency Medicine | Admitting: Emergency Medicine

## 2021-02-01 ENCOUNTER — Other Ambulatory Visit: Payer: Self-pay

## 2021-02-01 DIAGNOSIS — Z5321 Procedure and treatment not carried out due to patient leaving prior to being seen by health care provider: Secondary | ICD-10-CM | POA: Insufficient documentation

## 2021-02-01 DIAGNOSIS — S025XXD Fracture of tooth (traumatic), subsequent encounter for fracture with routine healing: Secondary | ICD-10-CM | POA: Insufficient documentation

## 2021-02-01 DIAGNOSIS — X58XXXD Exposure to other specified factors, subsequent encounter: Secondary | ICD-10-CM | POA: Insufficient documentation

## 2021-02-01 NOTE — ED Provider Notes (Cosign Needed)
Emergency Medicine Provider Triage Evaluation Note  Kyle Webb , a 25 y.o. male  was evaluated in triage.  Pt complains of fractured tooth that occurred pta.  Review of Systems  Positive: Fractured tooth Negative: Denies head trauma  Physical Exam  BP 133/90 (BP Location: Right Arm)   Pulse (!) 113   Temp 98.1 F (36.7 C) (Oral)   Resp 20   Ht 5\' 8"  (1.727 m)   Wt 79.4 kg   SpO2 98%   BMI 26.61 kg/m  Gen:   Awake, no distress   Resp:  Normal effort  MSK:   Moves extremities without difficulty  Other:  Tooth #8 is fractured  Medical Decision Making  Medically screening exam initiated at 8:34 PM.  Appropriate orders placed.  Kyle Webb was informed that the remainder of the evaluation will be completed by another provider, this initial triage assessment does not replace that evaluation, and the importance of remaining in the ED until their evaluation is complete.    Kyle Webb, Kyle Webb 02/01/21 2039

## 2021-02-01 NOTE — ED Triage Notes (Signed)
Pt elects to leave and go to emergency dentist in a.m.

## 2021-05-13 ENCOUNTER — Ambulatory Visit (HOSPITAL_COMMUNITY)
Admission: EM | Admit: 2021-05-13 | Discharge: 2021-05-15 | Disposition: A | Discharge status: Home / Self Care | Payer: BC Managed Care – PPO | Attending: Psychiatry | Admitting: Psychiatry

## 2021-05-13 DIAGNOSIS — Z20822 Contact with and (suspected) exposure to covid-19: Secondary | ICD-10-CM | POA: Diagnosis not present

## 2021-05-13 DIAGNOSIS — F1020 Alcohol dependence, uncomplicated: Secondary | ICD-10-CM

## 2021-05-13 DIAGNOSIS — F10129 Alcohol abuse with intoxication, unspecified: Secondary | ICD-10-CM | POA: Diagnosis not present

## 2021-05-13 LAB — CBC WITH DIFFERENTIAL/PLATELET
Abs Immature Granulocytes: 0.01 10*3/uL (ref 0.00–0.07)
Basophils Absolute: 0.1 10*3/uL (ref 0.0–0.1)
Basophils Relative: 1 %
Eosinophils Absolute: 0.1 10*3/uL (ref 0.0–0.5)
Eosinophils Relative: 2 %
HCT: 42.1 % (ref 39.0–52.0)
Hemoglobin: 14.3 g/dL (ref 13.0–17.0)
Immature Granulocytes: 0 %
Lymphocytes Relative: 29 %
Lymphs Abs: 1.5 10*3/uL (ref 0.7–4.0)
MCH: 33.8 pg (ref 26.0–34.0)
MCHC: 34 g/dL (ref 30.0–36.0)
MCV: 99.5 fL (ref 80.0–100.0)
Monocytes Absolute: 0.6 10*3/uL (ref 0.1–1.0)
Monocytes Relative: 11 %
Neutro Abs: 2.9 10*3/uL (ref 1.7–7.7)
Neutrophils Relative %: 57 %
Platelets: 306 10*3/uL (ref 150–400)
RBC: 4.23 MIL/uL (ref 4.22–5.81)
RDW: 11.7 % (ref 11.5–15.5)
WBC: 5.2 10*3/uL (ref 4.0–10.5)
nRBC: 0 % (ref 0.0–0.2)

## 2021-05-13 LAB — URINALYSIS, COMPLETE (UACMP) WITH MICROSCOPIC
Bacteria, UA: NONE SEEN
Bilirubin Urine: NEGATIVE
Glucose, UA: NEGATIVE mg/dL
Hgb urine dipstick: NEGATIVE
Ketones, ur: NEGATIVE mg/dL
Leukocytes,Ua: NEGATIVE
Nitrite: NEGATIVE
Protein, ur: NEGATIVE mg/dL
Specific Gravity, Urine: 1.008 (ref 1.005–1.030)
pH: 6 (ref 5.0–8.0)

## 2021-05-13 LAB — POCT URINE DRUG SCREEN - MANUAL ENTRY (I-SCREEN)
POC Amphetamine UR: NOT DETECTED
POC Buprenorphine (BUP): NOT DETECTED
POC Cocaine UR: NOT DETECTED
POC Marijuana UR: POSITIVE — AB
POC Methadone UR: NOT DETECTED
POC Methamphetamine UR: NOT DETECTED
POC Morphine: NOT DETECTED
POC Oxazepam (BZO): NOT DETECTED
POC Oxycodone UR: NOT DETECTED
POC Secobarbital (BAR): NOT DETECTED

## 2021-05-13 LAB — LIPID PANEL
Cholesterol: 317 mg/dL — ABNORMAL HIGH (ref 0–200)
HDL: >135 mg/dL (ref >40)
Triglycerides: 37 mg/dL (ref <150)
VLDL: 7 mg/dL (ref 0–40)

## 2021-05-13 LAB — COMPREHENSIVE METABOLIC PANEL
ALT: 123 U/L — ABNORMAL HIGH (ref 0–44)
AST: 159 U/L — ABNORMAL HIGH (ref 15–41)
Albumin: 4.4 g/dL (ref 3.5–5.0)
Alkaline Phosphatase: 53 U/L (ref 38–126)
Anion gap: 12 (ref 5–15)
BUN: 10 mg/dL (ref 6–20)
CO2: 23 mmol/L (ref 22–32)
Calcium: 9.3 mg/dL (ref 8.9–10.3)
Chloride: 103 mmol/L (ref 98–111)
Creatinine, Ser: 0.6 mg/dL — ABNORMAL LOW (ref 0.61–1.24)
GFR, Estimated: >60 mL/min (ref >60)
Glucose, Bld: 80 mg/dL (ref 70–99)
Potassium: 3.4 mmol/L — ABNORMAL LOW (ref 3.5–5.1)
Sodium: 138 mmol/L (ref 135–145)
Total Bilirubin: 0.8 mg/dL (ref 0.3–1.2)
Total Protein: 7.9 g/dL (ref 6.5–8.1)

## 2021-05-13 LAB — TSH: TSH: 1.764 u[IU]/mL (ref 0.350–4.500)

## 2021-05-13 LAB — HEPATITIS PANEL, ACUTE
HCV Ab: NONREACTIVE
Hep A IgM: NONREACTIVE
Hep B C IgM: NONREACTIVE
Hepatitis B Surface Ag: NONREACTIVE

## 2021-05-13 LAB — HIV ANTIBODY (ROUTINE TESTING W REFLEX): HIV Screen 4th Generation wRfx: NONREACTIVE

## 2021-05-13 LAB — RESP PANEL BY RT-PCR (FLU A&B, COVID) ARPGX2
Influenza A by PCR: NEGATIVE
Influenza B by PCR: NEGATIVE
SARS Coronavirus 2 by RT PCR: NEGATIVE

## 2021-05-13 LAB — ETHANOL: Alcohol, Ethyl (B): 202 mg/dL — ABNORMAL HIGH (ref <10)

## 2021-05-13 LAB — POC SARS CORONAVIRUS 2 AG: SARSCOV2ONAVIRUS 2 AG: NEGATIVE

## 2021-05-13 LAB — POC SARS CORONAVIRUS 2 AG -  ED: SARS Coronavirus 2 Ag: NEGATIVE

## 2021-05-13 LAB — HEMOGLOBIN A1C
Hgb A1c MFr Bld: 5.1 % (ref 4.8–5.6)
Mean Plasma Glucose: 99.67 mg/dL

## 2021-05-13 LAB — MAGNESIUM: Magnesium: 1.9 mg/dL (ref 1.7–2.4)

## 2021-05-13 MED ORDER — ALUM & MAG HYDROXIDE-SIMETH 200-200-20 MG/5ML PO SUSP: 30.0000 mL | ORAL | Status: DC | PRN

## 2021-05-13 MED ORDER — LORAZEPAM 1 MG PO TABS: 1.0000 mg | ORAL_TABLET | Freq: Four times a day (QID) | ORAL | Status: DC | PRN

## 2021-05-13 MED ORDER — TRAZODONE HCL 50 MG PO TABS
50.0000 mg | ORAL_TABLET | Freq: Every evening | ORAL | Status: DC | PRN
  Administered 2021-05-13: 50 mg via ORAL
  Filled 2021-05-13: qty 1

## 2021-05-13 MED ORDER — THIAMINE HCL 100 MG/ML IJ SOLN: 100.0000 mg | Freq: Once | INTRAMUSCULAR | Status: DC

## 2021-05-13 MED ORDER — POTASSIUM CHLORIDE CRYS ER 20 MEQ PO TBCR
40.0000 meq | EXTENDED_RELEASE_TABLET | Freq: Once | ORAL | Status: AC
  Administered 2021-05-13: 40 meq via ORAL
  Filled 2021-05-13: qty 2

## 2021-05-13 MED ORDER — LOPERAMIDE HCL 2 MG PO CAPS
2.0000 mg | ORAL_CAPSULE | ORAL | Status: DC | PRN
Start: 2021-05-13 — End: 2021-05-15

## 2021-05-13 MED ORDER — ADULT MULTIVITAMIN W/MINERALS CH
ORAL_TABLET | ORAL | Status: AC
  Filled 2021-05-13: qty 1

## 2021-05-13 MED ORDER — HYDROXYZINE HCL 25 MG PO TABS: 25.0000 mg | ORAL_TABLET | Freq: Three times a day (TID) | ORAL | Status: DC | PRN

## 2021-05-13 MED ORDER — MAGNESIUM HYDROXIDE 400 MG/5ML PO SUSP: 30.0000 mL | Freq: Every day | ORAL | Status: DC | PRN

## 2021-05-13 MED ORDER — HYDROXYZINE HCL 25 MG PO TABS
25.0000 mg | ORAL_TABLET | Freq: Four times a day (QID) | ORAL | Status: DC | PRN
  Administered 2021-05-13: 25 mg via ORAL
  Filled 2021-05-13: qty 1

## 2021-05-13 MED ORDER — ONDANSETRON 4 MG PO TBDP: 4.0000 mg | ORAL_TABLET | Freq: Four times a day (QID) | ORAL | Status: DC | PRN

## 2021-05-13 MED ORDER — THIAMINE HCL 100 MG PO TABS
100.0000 mg | ORAL_TABLET | Freq: Every day | ORAL | Status: DC
  Administered 2021-05-14 – 2021-05-15 (×2): 100 mg via ORAL
  Filled 2021-05-13 (×2): qty 1

## 2021-05-13 MED ORDER — ACETAMINOPHEN 325 MG PO TABS: 650.0000 mg | ORAL_TABLET | Freq: Four times a day (QID) | ORAL | Status: DC | PRN

## 2021-05-13 MED ORDER — ADULT MULTIVITAMIN W/MINERALS CH
1.0000 | ORAL_TABLET | Freq: Every day | ORAL | Status: DC
  Administered 2021-05-13 – 2021-05-15 (×3): 1 via ORAL
  Filled 2021-05-13 (×2): qty 1

## 2021-05-13 NOTE — Progress Notes (Addendum)
Pt is admitted to [facility] due alcohol abuse. Pt is alert, oriented and appeared anxious. Pt is ambulatory and is oriented to staff/unit. Pt was cooperative with skin assessment and the admission process. Bruises were noted on pt's right flank and scratch on left ankle. Pt denies current pain, SI/HI/AVH. PRN Vistaril was administered for anxiety. Pt's CIWA was a 2 on admission. Nourishment given. Staff will monitor pt for safety.

## 2021-05-13 NOTE — ED Provider Notes (Signed)
[facility] Admission Suicide Risk Assessment   Nursing information obtained from:   chart Demographic factors:   young adult, low socioeconomic status Current Mental Status:   see subjective data Loss Factors:   employment, low socioeconomic status Historical Factors:   substance use, impulsivity Risk Reduction Factors:   responsibility to family  Total Time spent with patient: 45 minutes Principal Problem: <principal problem not specified> Diagnosis:  Active Problems:   Alcohol abuse with intoxication (HCC)  Subjective Data: Kyle Webb, 25 y.o., male patient seen face to face by this provider, consulted with Dr. Bronwen Betters; and chart reviewed on 05/13/21.  On evaluation Kyle Webb reports he has a problem with alcohol.  Reports he drinks at the minimum 8-9 beers throughout the day every day.  His last alcohol intake was one hour ago. Reports crack cocaine and marijuana use occasionally, last use a few weeks ago. Reports he has drank every day since he was 25 years old.  States he has tried rehabilitation in the past and it was unsuccessful.  Reports his last attempt to detox from alcohol was in April and it was unsuccessful.  Reports, "I am ready at this time to get the help I need"  States he fears for his safety and his life due to his alcohol abuse states that if he goes home today he knows he will drink and he is afraid he will not wake up.  He denies any history of seizures or delirium tremors when withdrawing from alcohol. States he is unable to maintain a job due to his alcohol use. Reports he lives with a roommate.    During evaluation Kyle Webb is pacing around the room.  He is in no acute distress.  He is disheveled and makes good eye contact.  He is alert/oriented x 4 and cooperative.  He is anxious with congruent affect.  His speech is clear, coherent, normal rate and tone, but it is slurred at times. Reports decrease in sleep and poor appetite.  He is thought process is  coherent and relevant; There is no indication that he is currently responding to internal/external stimuli or experiencing delusional thought content; and he has denied suicidal/self-harm/homicidal ideation, psychosis, and paranoia.  Patient has remained calm throughout assessment and has answered questions appropriately.     Collateral: Mother was present during evaluation. Reports patient is unable to function in life. States his alcoholism rules his every move. Mother states if patient returns home, she fears he will drink himself to death.   Continued Clinical Symptoms:    The "Alcohol Use Disorders Identification Test", Guidelines for Use in Primary Care, Second Edition.  World Science writer [facility]). Score between 0-7:  no or low risk or alcohol related problems. Score between 8-15:  moderate risk of alcohol related problems. Score between 16-19:  high risk of alcohol related problems. Score 20 or above:  warrants further diagnostic evaluation for alcohol dependence and treatment.   CLINICAL FACTORS:   Severe Anxiety and/or Agitation Alcohol/Substance Abuse/Dependencies   Musculoskeletal: Strength & Muscle Tone: within normal limits Gait & Station: normal Patient leans: N/A  Psychiatric Specialty Exam:  Presentation  General Appearance: Disheveled  Eye Contact:Good  Speech:Clear and Coherent; Slurred (slurred at times)  Speech Volume:Normal  Handedness:Right   Mood and Affect  Mood:Anxious  Affect:Congruent   Thought Process  Thought Processes:Coherent  Descriptions of Associations:Intact  Orientation:Full (Time, Place and Person)  Thought Content:Logical  History of Schizophrenia/Schizoaffective disorder:No  Duration of Psychotic Symptoms:No data  recorded Hallucinations:Hallucinations: None  Ideas of Reference:None  Suicidal Thoughts:Suicidal Thoughts: No  Homicidal Thoughts:Homicidal Thoughts: No   Sensorium  Memory:Immediate Good; Recent  Good; Remote Good  Judgment:Fair  Insight:Fair   Executive Functions  Concentration:Fair  Attention Span:Fair  Recall:Good  Fund of Knowledge:Good  Language:Good   Psychomotor Activity  Psychomotor Activity:Psychomotor Activity: Normal   Assets  Assets:Communication Skills; Desire for Improvement; Financial Resources/Insurance; Housing; Physical Health; Resilience; Social Support   Sleep  Sleep:Sleep: Fair    Physical Exam: Physical Exam see H&P admission note ROSsee H&P admission note Blood pressure (!) 130/93, pulse 74, temperature 98 F (36.7 C), temperature source Oral, resp. rate 18, SpO2 100 %. There is no height or weight on file to calculate BMI.   COGNITIVE FEATURES THAT CONTRIBUTE TO RISK:  None    SUICIDE RISK:   Minimal: No identifiable suicidal ideation.  Patients presenting with no risk factors but with morbid ruminations; may be classified as minimal risk based on the severity of the depressive symptoms  PLAN OF CARE:  Based on my evaluation the patient does not appear to have an emergency medical condition.   Admit patient to the [facility].   Lab work ordered: CBC with differential, CMP, ethanol, hemoglobin A1c, hepatitis panel, acute, HIV antibody, lipid panel, magnesium, RPR, TSH, U/A, urine drug screen, GC/chlamydia. EKG ordered   CIWA protocol initiated with Ativan every 6 hours as needed   I certify that inpatient services furnished can reasonably be expected to improve the patient's condition.   Ardis Hughs, NP 05/13/2021, 12:16 PM

## 2021-05-13 NOTE — ED Notes (Signed)
Lunch given.

## 2021-05-13 NOTE — ED Provider Notes (Addendum)
Behavioral Health Admission [facility]&P [facility] OBS)  Date: 05/13/21 Patient Name: Kyle Webb MRN: [id number] Chief Complaint:  Chief Complaint  Patient presents with   Alcohol Problem      Diagnoses:  Final diagnoses:  Alcohol abuse with intoxication [facility])    HPI:   Kyle Webb 25 y.o. male patient presented to [facility] as a walk in  accompanied by his mother with complaints of "with my alcohol problem".  Kyle Webb, 25 y.o., male patient seen face to face by this provider, consulted with Dr. Bronwen Betters; and chart reviewed on 05/13/21.  On evaluation SHERRON MUMMERT reports he has a problem with alcohol.  Reports he drinks at the minimum 8-9 beers throughout the day every day.  His last alcohol intake was one hour ago. Reports crack cocaine and marijuana use occasionally, last use a few weeks ago. Reports he has drank every day since he was 25 years old.  States he has tried rehabilitation in the past and it was unsuccessful.  Reports his last attempt to detox from alcohol was in April and it was unsuccessful.  Reports, "I am ready at this time to get the help I need"  States he fears for his safety and his life due to his alcohol abuse states that if he goes home today he knows he will drink and he is afraid he will not wake up.  He denies any history of seizures or delirium tremors when withdrawing from alcohol. States he is unable to maintain a job due to his alcohol use. Reports he lives with a roommate.   During evaluation Gabrielle Q Salido is pacing around the room.  He is in no acute distress.  He is disheveled and makes good eye contact.  He is alert/oriented x 4 and cooperative.  He is anxious with congruent affect.  His speech is clear, coherent, normal rate and tone, but it is slurred at times. Reports decrease in sleep and poor appetite.  He is thought process is coherent and relevant; There is no indication that he is currently responding to internal/external stimuli or  experiencing delusional thought content; and he has denied suicidal/self-harm/homicidal ideation, psychosis, and paranoia.  Patient has remained calm throughout assessment and has answered questions appropriately.    Collateral: Mother was present during evaluation. Reports patient is unable to function in life. States his alcoholism rules his every move. Mother states if patient returns home, she fears he will drink himself to death.    PHQ 2-9:  Flowsheet Row Office Visit from 03/03/2016 in [facility] Office Visit from 03/14/2015 in [facility]  Thoughts that you would be better off dead, or of hurting yourself in some way Not at all Not at all  PHQ-9 Total Score 1 0       Flowsheet Row ED from 12/22/2020 in [facility]  C-SSRS RISK CATEGORY No Risk        Total Time spent with patient: 45 minutes  Musculoskeletal  Strength & Muscle Tone: within normal limits Gait & Station: normal Patient leans: N/A  Psychiatric Specialty Exam  Presentation General Appearance: Disheveled  Eye Contact:Good  Speech:Clear and Coherent; Slurred (slurred at times)  Speech Volume:Normal  Handedness:Right   Mood and Affect  Mood:Anxious  Affect:Congruent   Thought Process  Thought Processes:Coherent  Descriptions of Associations:Intact  Orientation:Full (Time, Place and Person)  Thought Content:Logical    Hallucinations:Hallucinations: None  Ideas of Reference:None  Suicidal Thoughts:Suicidal Thoughts: No  Homicidal Thoughts:Homicidal Thoughts: No   Sensorium  Memory:Immediate Good; Recent Good; Remote Good  Judgment:Fair  Insight:Fair   Executive Functions  Concentration:Fair  Attention Span:Fair  Recall:Good  Fund of Knowledge:Good  Language:Good   Psychomotor Activity  Psychomotor Activity:Psychomotor Activity: Normal   Assets  Assets:Communication Skills; Desire for Improvement; Financial  Resources/Insurance; Housing; Physical Health; Resilience; Social Support   Sleep  Sleep:Sleep: Fair   No data recorded  Physical Exam Vitals and nursing note reviewed.  Constitutional:      Appearance: He is well-developed.  HENT:     Head: Normocephalic.  Eyes:     General:        Right eye: No discharge.        Left eye: No discharge.     Conjunctiva/sclera: Conjunctivae normal.  Cardiovascular:     Rate and Rhythm: Normal rate.     Heart sounds: No murmur heard. Pulmonary:     Effort: Pulmonary effort is normal. No respiratory distress.     Breath sounds: Normal breath sounds.  Musculoskeletal:        General: Normal range of motion.     Cervical back: Normal range of motion.  Skin:    Coloration: Skin is not jaundiced or pale.  Neurological:     Mental Status: He is alert and oriented to person, place, and time.  Psychiatric:        Attention and Perception: Attention and perception normal.        Mood and Affect: Mood is anxious.        Speech: Speech is slurred.        Behavior: Behavior is cooperative.        Thought Content: Thought content normal.        Cognition and Memory: Cognition normal.        Judgment: Judgment is impulsive.   Review of Systems  Constitutional: Negative.  Negative for fever.  HENT: Negative.  Negative for hearing loss.   Eyes: Negative.   Respiratory: Negative.  Negative for cough.   Cardiovascular: Negative.  Negative for chest pain.  Musculoskeletal: Negative.   Skin: Negative.   Neurological: Negative.   Psychiatric/Behavioral:  The patient is nervous/anxious.    Blood pressure (!) 130/93, pulse 74, temperature 98 F (36.7 C), temperature source Oral, resp. rate 18, SpO2 100 %. There is no height or weight on file to calculate BMI.  Past Psychiatric History: Alcohol abuse, patient reports PTSD  Is the patient at risk to self? Yes  Has the patient been a risk to self in the past 6 months? Yes .    Has the patient been a  risk to self within the distant past? Yes   Is the patient a risk to others? No   Has the patient been a risk to others in the past 6 months? No   Has the patient been a risk to others within the distant past? No   Past Medical History:  Past Medical History:  Diagnosis Date   ADHD (attention deficit hyperactivity disorder)    Sinus pressure    No past surgical history on file.  Family History:  Family History  Problem Relation Age of Onset   Hypertension Maternal Grandfather    Hypertension Paternal Grandfather    Alcohol abuse Neg Hx    Arthritis Neg Hx    Asthma Neg Hx    Birth defects Neg Hx    Cancer Neg Hx    COPD Neg Hx  Depression Neg Hx    Diabetes Neg Hx    Drug abuse Neg Hx    Early death Neg Hx    Hearing loss Neg Hx    Heart disease Neg Hx    Hyperlipidemia Neg Hx    Kidney disease Neg Hx    Learning disabilities Neg Hx    Mental illness Neg Hx    Mental retardation Neg Hx    Miscarriages / Stillbirths Neg Hx    Stroke Neg Hx    Vision loss Neg Hx    Varicose Veins Neg Hx     Social History:  Social History   Socioeconomic History   Marital status: Single    Spouse name: Not on file   Number of children: Not on file   Years of education: Not on file   Highest education level: Not on file  Occupational History   Not on file  Tobacco Use   Smoking status: Every Day    Types: Cigarettes   Smokeless tobacco: Never  Vaping Use   Vaping Use: Never used  Substance and Sexual Activity   Alcohol use: Yes    Alcohol/week: 0.0 standard drinks    Comment: daily x 2-3 weeks   Drug use: Not Currently    Types: Marijuana    Comment: none x 1 week   Sexual activity: Never  Other Topics Concern   Not on file  Social History Narrative   Not on file   Social Determinants of Health   Financial Resource Strain: Not on file  Food Insecurity: Not on file  Transportation Needs: Not on file  Physical Activity: Not on file  Stress: Not on file  Social  Connections: Not on file  Intimate Partner Violence: Not on file    SDOH:  SDOH Screenings   Alcohol Screen: Not on file  Depression (PHQ2-9): Not on file  Financial Resource Strain: Not on file  Food Insecurity: Not on file  Housing: Not on file  Physical Activity: Not on file  Social Connections: Not on file  Stress: Not on file  Tobacco Use: High Risk   Smoking Tobacco Use: Every Day   Smokeless Tobacco Use: Never  Transportation Needs: Not on file    Last Labs:  Admission on 12/22/2020, Discharged on 12/22/2020  Component Date Value Ref Range Status   Lipase 12/22/2020 32  11 - 51 U/L Final   Performed at [facility], [address], [place], [place] [address]   Sodium 12/22/2020 140  135 - 145 mmol/L Final   Potassium 12/22/2020 3.8  3.5 - 5.1 mmol/L Final   Chloride 12/22/2020 106  98 - 111 mmol/L Final   CO2 12/22/2020 22  22 - 32 mmol/L Final   Glucose, Bld 12/22/2020 115 (A) 70 - 99 mg/dL Final   Glucose reference range applies only to samples taken after fasting for at least 8 hours.   BUN 12/22/2020 7  6 - 20 mg/dL Final   Creatinine, Ser 12/22/2020 0.67  0.61 - 1.24 mg/dL Final   Calcium 49/70/2637 8.6 (A) 8.9 - 10.3 mg/dL Final   Total Protein 85/88/5027 7.2  6.5 - 8.1 g/dL Final   Albumin 74/08/8785 4.1  3.5 - 5.0 g/dL Final   AST 76/72/0947 56 (A) 15 - 41 U/L Final   ALT 12/22/2020 31  0 - 44 U/L Final   Alkaline Phosphatase 12/22/2020 51  38 - 126 U/L Final   Total Bilirubin 12/22/2020 0.7  0.3 -  1.2 mg/dL Final   GFR, Estimated 12/22/2020 >60  >60 mL/min Final   Comment: (NOTE) Calculated using the CKD-EPI Creatinine Equation (2021)    Anion gap 12/22/2020 12  5 - 15 Final   Performed at [facility], [address] [place], [place] [address]   WBC 12/22/2020 7.6  4.0 - 10.5 K/uL Final   RBC 12/22/2020 4.32  4.22 - 5.81 MIL/uL Final   Hemoglobin 12/22/2020 14.0  13.0 - 17.0 g/dL Final   HCT 60/45/409804/16/2022 39.3   39.0 - 52.0 % Final   MCV 12/22/2020 91.0  80.0 - 100.0 fL Final   MCH 12/22/2020 32.4  26.0 - 34.0 pg Final   MCHC 12/22/2020 35.6  30.0 - 36.0 g/dL Final   RDW 11/91/478204/16/2022 11.3 (A) 11.5 - 15.5 % Final   Platelets 12/22/2020 360  150 - 400 K/uL Final   nRBC 12/22/2020 0.0  0.0 - 0.2 % Final   Performed at [facility], [address] [place], [place] [address]   Color, Urine 12/22/2020 COLORLESS (A) YELLOW Final   APPearance 12/22/2020 CLEAR  CLEAR Final   Specific Gravity, Urine 12/22/2020 1.004 (A) 1.005 - 1.030 Final   pH 12/22/2020 7.0  5.0 - 8.0 Final   Glucose, UA 12/22/2020 NEGATIVE  NEGATIVE mg/dL Final   Hgb urine dipstick 12/22/2020 NEGATIVE  NEGATIVE Final   Bilirubin Urine 12/22/2020 NEGATIVE  NEGATIVE Final   Ketones, ur 12/22/2020 NEGATIVE  NEGATIVE mg/dL Final   Protein, ur 30/86/578404/16/2022 NEGATIVE  NEGATIVE mg/dL Final   Nitrite 69/62/952804/16/2022 NEGATIVE  NEGATIVE Final   Leukocytes,Ua 12/22/2020 NEGATIVE  NEGATIVE Final   Performed at [facility], [address] [place], [place] [address]    Allergies: Patient has no known allergies.  PTA Medications: (Not in a hospital admission)   Medical Decision Making  Patient presents voluntarily requesting substance abuse treatment and detox.  States if he does not get help and is able to leave facility he knows he will drink.  States he fears for his safety, and worries he will drink himself to death.  [facility]  Social work referral has been made to seek treatment facilities.    Recommendations  Based on my evaluation the patient does not appear to have an emergency medical condition.  [facility]  Lab work ordered: CBC with differential, CMP, ethanol, hemoglobin A1c, hepatitis panel, acute, HIV antibody, lipid panel, magnesium, RPR, TSH, U/A, urine drug screen, GC/chlamydia. EKG ordered  CIWA protocol initiated with Ativan every 6 hours as  needed.   Ardis Hughsarolyn H Ila Landowski, NP 05/13/21  8:43 AM

## 2021-05-13 NOTE — Progress Notes (Signed)
Received Kyle Webb in the OBS area earlier  waiting for his Covid results. Has been pacing the floor at intervals. He endorsed  passive SI thoughts without a plan. He received nourishments and was compliant with his medication. He was transferred to [facility] at 1307 hrs without incident with his paperwork.

## 2021-05-13 NOTE — Progress Notes (Signed)
Pt complained of cramping in his arms. Potassium chloride administered per MD order. Will continue to monitor.

## 2021-05-13 NOTE — Progress Notes (Signed)
   05/13/21 0653  [facility] Triage Screening (Walk-ins at [facility] only)  How Did You Hear About [place]? Family/Friend  What Is the Reason for Your Visit/Call Today? Pt is requesting treatment for alcohol use. He says he has had treatment before but has a 11-month-old son and is now motivated for treatment. Pt appears intoxicated and reports his last drink was 30 minutes ago.  How Long Has This Been Causing You Problems? > than 6 months  Have You Recently Had Any Thoughts About Hurting Yourself? No  Are You Planning to Commit Suicide/Harm Yourself At This time? No  Have you Recently Had Thoughts About Hurting Someone Karolee Ohs? No  Are You Planning To Harm Someone At This Time? No  Are you currently experiencing any auditory, visual or other hallucinations? No  Have You Used Any Alcohol or Drugs in the Past 24 Hours? Yes  How long ago did you use Drugs or Alcohol? 30 minutes ago  What Did You Use and How Much? Alcohol: Three cans of 16-ounce beer. Marijuana: 1-2 joints.  Do you have any current medical co-morbidities that require immediate attention? No  Clinician description of patient physical appearance/behavior: Pt appears intoxicated with slightly slurred speech, slowed response time. He appears to have urinated on himself.  What Do You Feel Would Help You the Most Today? Alcohol or Drug Use Treatment  If access to [facility] was not available, would you have sought care in the Emergency Department? Yes  Determination of Need Urgent (48 hours)  Options For Referral [facility];Facility-Based Crisis;Inpatient Hospitalization   Pt presents requesting alcohol detox. He denies depressive symptoms. He denies SI, HI, or AV.

## 2021-05-13 NOTE — BH Assessment (Signed)
Comprehensive Clinical Assessment (CCA) Note  05/13/2021 Kyle Webb [id number] DISPOSITION: Effie Shy NP recommends ongoing observation as patient will be reviewed by substance facilities. LCSW to assist with providing patient with rehabilitation resources so he can contact and interview with them to assist with ongoing needs.  Flowsheet Row ED from 05/13/2021 in [facility] ED from 12/22/2020 in [facility]  C-SSRS RISK CATEGORY Low Risk No Risk      The patient demonstrates the following risk factors for suicide: Chronic risk factors for suicide include: substance use disorder. Acute risk factors for suicide include:  SA issues . Protective factors for this patient include: coping skills. Considering these factors, the overall suicide risk at this point appears to be low. Patient is not appropriate for outpatient follow up.   Patient is a 25 year old male that presents voluntary to [facility] requesting assistance with ongoing alcohol use. Patient reports that he is consuming various amounts of alcohol daily with last use prior to arrival when he reported he consumed three 16 ounce beers and 1 gram of Cannabis. Patient is observed to be impaired at the time of assessment. Patient states he has been using daily since age 25 although recently his intake has increased. Patient denies any S/I, H/I or AVH. Patient denies any prior attempts or gestures. Patient denies any current mental health diagnosis and he is currently not followed by a OP provider. Per chart review patient was seen in 2018 when he presented to [facility] for a accidental overdose on heroin. Patient denies any history of abuse or access to firearms. Patient is requesting assistance with locating a SA facility to assist with ongoing needs.    Effie Shy NP writes on arrival: Kyle Webb, 25 y.o., male patient seen face to face by this provider, consulted with Dr. Bronwen Betters; and chart  reviewed on 05/13/21.  On evaluation Kyle Webb reports he has a problem with alcohol.  Reports he drinks at the minimum 8-9 beers throughout the day every day.  His last alcohol intake was one hour ago.  Reports he has drank every day since he was 25 years old.  States he has tried rehabilitation in the past and it was unsuccessful.  Reports his last attempt to detox from alcohol was in April and it was unsuccessful.  Reports, "I am ready at this time to get the help I need"  States he fears for his safety and his life due to his alcohol abuse states that if he goes home today he knows he will drink and he is afraid he will not wake up.  He denies any history of seizures or delirium tremors when withdrawing from alcohol.   During evaluation Kyle Webb is pacing around the room.  He is in no acute distress.  He is disheveled and makes good eye contact.  He is alert/oriented x 4 and cooperative.  He is anxious with congruent affect.  His speech is clear, coherent, normal rate and tone, but it is slurred at times.  He is thought process is coherent and relevant; There is no indication that he is currently responding to internal/external stimuli or experiencing delusional thought content; and he has denied suicidal/self-harm/homicidal ideation, psychosis, and paranoia.  Patient has remained calm throughout assessment and has answered questions appropriately.        Chief Complaint:  Chief Complaint  Patient presents with   Alcohol Problem   Visit Diagnosis: Alcohol induced mood disorder  CCA Screening, Triage and Referral (STR)  Patient Reported Information How did you hear about [place]? Self  What Is the Reason for Your Visit/Call Today? Patient is requesting assistance with ongoing alcohol issues  How Long Has This Been Causing You Problems? > than 6 months  What Do You Feel Would Help You the Most Today? Alcohol or Drug Use Treatment   Have You Recently Had Any Thoughts About  Hurting Yourself? No  Are You Planning to Commit Suicide/Harm Yourself At This time? No   Have you Recently Had Thoughts About Hurting Someone Karolee Ohs? No  Are You Planning to Harm Someone at This Time? No  Explanation: No data recorded  Have You Used Any Alcohol or Drugs in the Past 24 Hours? Yes  How Long Ago Did You Use Drugs or Alcohol? No data recorded What Did You Use and How Much? Pt states he used a unknown amount prior to arrival   Do You Currently Have a Therapist/Psychiatrist? No  Name of Therapist/Psychiatrist: No data recorded  Have You Been Recently Discharged From Any Office Practice or Programs? No  Explanation of Discharge From Practice/Program: No data recorded    CCA Screening Triage Referral Assessment Type of Contact: Face-to-Face  Telemedicine Service Delivery:   Is this Initial or Reassessment? No data recorded Date Telepsych consult ordered in [facility]:  No data recorded Time Telepsych consult ordered in [facility]:  No data recorded Location of Assessment: [facility]  Provider Location: [facility] Services   Collateral Involvement: None at this time   Does Patient Have a Automotive engineer Guardian? No data recorded Name and Contact of Legal Guardian: No data recorded If Minor and Not Living with Parent(s), Who has Custody? None  Is CPS involved or ever been involved? Never  Is APS involved or ever been involved? Never   Patient Determined To Be At Risk for Harm To Self or Others Based on Review of Patient Reported Information or Presenting Complaint? No  Method: No data recorded Availability of Means: No data recorded Intent: No data recorded Notification Required: No data recorded Additional Information for Danger to Others Potential: No data recorded Additional Comments for Danger to Others Potential: No data recorded Are There Guns or Other Weapons in Your Home? No data recorded Types of Guns/Weapons: No data  recorded Are These Weapons Safely Secured?                            No data recorded Who Could Verify You Are Able To Have These Secured: No data recorded Do You Have any Outstanding Charges, Pending Court Dates, Parole/Probation? No data recorded Contacted To Inform of Risk of Harm To Self or Others: Other: Comment (NA)    Does Patient Present under Involuntary Commitment? No  IVC Papers Initial File Date: No data recorded  [place] of Residence: [place]   Patient Currently Receiving the Following Services: Not Receiving Services   Determination of Need: Urgent (48 hours)   Options For Referral: Outpatient Therapy     CCA Biopsychosocial Patient Reported Schizophrenia/Schizoaffective Diagnosis in Past: No   Strengths: Pt is willing to participate in treatment   Mental Health Symptoms Depression:   Change in energy/activity; Hopelessness   Duration of Depressive symptoms:  Duration of Depressive Symptoms: Greater than two weeks   Mania:   None   Anxiety:    Irritability   Psychosis:   None   Duration of Psychotic  symptoms:    Trauma:   None   Obsessions:   None   Compulsions:   None   Inattention:   None   Hyperactivity/Impulsivity:   None   Oppositional/Defiant Behaviors:   None   Emotional Irregularity:   Chronic feelings of emptiness   Other Mood/Personality Symptoms:   NA    Mental Status Exam Appearance and self-care  Stature:   Average   Weight:   Average weight   Clothing:   Neat/clean   Grooming:   Normal   Cosmetic use:   None   Posture/gait:   Normal   Motor activity:   Not Remarkable   Sensorium  Attention:   Normal   Concentration:   Normal   Orientation:   X5   Recall/memory:   Normal   Affect and Mood  Affect:   Appropriate   Mood:   Anxious   Relating  Eye contact:   Normal   Facial expression:   Responsive   Attitude toward examiner:   Cooperative   Thought and Language   Speech flow:  Clear and Coherent   Thought content:   Appropriate to Mood and Circumstances   Preoccupation:   None   Hallucinations:   None   Organization:  No data recorded  Affiliated Computer ServicesExecutive Functions  Fund of Knowledge:   Fair   Intelligence:   Average   Abstraction:   Normal   Judgement:   Normal   Reality Testing:   Realistic   Insight:   Good   Decision Making:   Normal   Social Functioning  Social Maturity:   Responsible   Social Judgement:   Normal   Stress  Stressors:   Other (Comment) (Ongoing SA issues)   Coping Ability:   Normal   Skill Deficits:   None   Supports:   Usual     Religion: Religion/Spirituality Are You A Religious Person?: No  Leisure/Recreation: Leisure / Recreation Do You Have Hobbies?: No  Exercise/Diet: Exercise/Diet Do You Exercise?: No Do You Follow a Special Diet?: No Do You Have Any Trouble Sleeping?: No   CCA Employment/Education Employment/Work Situation: Employment / Work Situation Employment Situation: Unemployed  Education: Education Did Theme park managerYou Attend College?: No Did You Have An Individualized Education Program (IIEP): No Did You Have Any Difficulty At Progress EnergySchool?: No Patient's Education Has Been Impacted by Current Illness: No   CCA Family/Childhood History Family and Relationship History: Family history Marital status: Single Does patient have children?: No  Childhood History:  Childhood History Did patient suffer any verbal/emotional/physical/sexual abuse as a child?: No Did patient suffer from severe childhood neglect?: No Has patient ever been sexually abused/assaulted/raped as an adolescent or adult?: No Was the patient ever a victim of a crime or a disaster?: No Witnessed domestic violence?: No Has patient been affected by domestic violence as an adult?: No  Child/Adolescent Assessment:     CCA Substance Use Alcohol/Drug Use: Alcohol / Drug Use Pain Medications: See  MAR Prescriptions: See MAR Over the Counter: See MAR History of alcohol / drug use?: Yes Substance #1 Name of Substance 1: Alcohol 1 - Age of First Use: 19 1 - Amount (size/oz): Varies 1 - Frequency: Varies 1 - Duration: Ongoing 1 - Last Use / Amount: Prior to arrival "3 beers"                       ASAM's:  Six Dimensions of Multidimensional Assessment  Dimension 1:  Acute Intoxication and/or  Withdrawal Potential:   Dimension 1:  Description of individual's past and current experiences of substance use and withdrawal: 1  Dimension 2:  Biomedical Conditions and Complications:   Dimension 2:  Description of patient's biomedical conditions and  complications: 2  Dimension 3:  Emotional, Behavioral, or Cognitive Conditions and Complications:  Dimension 3:  Description of emotional, behavioral, or cognitive conditions and complications: 2  Dimension 4:  Readiness to Change:  Dimension 4:  Description of Readiness to Change criteria: 2  Dimension 5:  Relapse, Continued use, or Continued Problem Potential:  Dimension 5:  Relapse, continued use, or continued problem potential critiera description: 2  Dimension 6:  Recovery/Living Environment:  Dimension 6:  Recovery/Iiving environment criteria description: 2  ASAM Severity Score: ASAM's Severity Rating Score: 11  ASAM Recommended Level of Treatment:     Substance use Disorder (SUD) Substance Use Disorder (SUD)  Checklist Symptoms of Substance Use: Continued use despite having a persistent/recurrent physical/psychological problem caused/exacerbated by use  Recommendations for Services/Supports/Treatments:    Discharge Disposition:    DSM5 Diagnoses: Patient Active Problem List   Diagnosis Date Noted   ADHD (attention deficit hyperactivity disorder) 06/19/2012     Referrals to Alternative Service(s): Referred to Alternative Service(s):   Place:   Date:   Time:    Referred to Alternative Service(s):   Place:   Date:    Time:    Referred to Alternative Service(s):   Place:   Date:   Time:    Referred to Alternative Service(s):   Place:   Date:   Time:     Alfredia Ferguson, LCAS

## 2021-05-13 NOTE — Progress Notes (Signed)
Pt attended [facility] group therapy.

## 2021-05-14 ENCOUNTER — Encounter (HOSPITAL_COMMUNITY): Payer: Self-pay

## 2021-05-14 LAB — RPR: RPR Ser Ql: NONREACTIVE

## 2021-05-14 NOTE — ED Notes (Signed)
Patient is resting in bed with eyes closed. Respirations are even and non labored. No distress noted. Monitoring continues. 

## 2021-05-14 NOTE — ED Notes (Signed)
Patient A&Ox4. Denies intent to harm self/others when asked. Denies A/VH. Patient denies any physical complaints when asked. No acute distress noted. Pt request to stay another night and discharge tomorrow. Pt reports he will stay at his parents home. MD notified of pt's request. Support and encouragement provided. Routine safety checks conducted according to facility protocol. Encouraged patient to notify staff if thoughts of harm toward self or others arise. Patient verbalize understanding and agreement. Will continue to monitor.

## 2021-05-14 NOTE — ED Notes (Signed)
Patient is alert and verbal. Patient denies SI/HI at this time. Patient voices no withdrawal symptoms. Patient is out in milieu. Patient provided support and encouragement. Monitoring continues.

## 2021-05-14 NOTE — ED Notes (Signed)
Pt is in room sleeping, respirations are even/unlabored, environment check complete/safe, will continue to monitor patient for safety

## 2021-05-14 NOTE — ED Notes (Signed)
Pt is in bed sleeping, respirations are even/unlabored, environment check complete secure, will continue to monitor patient for safety 

## 2021-05-14 NOTE — ED Provider Notes (Signed)
Behavioral Health Progress Note  Date and Time: 05/14/2021 2:47 PM Name: Kyle Webb MRN:  [id number]  Subjective:   Patient seen and chart reviewed. CIWA of 2 yesterday, most recent CIWA 0. Patient interviewed this afternoon. He states that he is not interested in substance use treatment and states that "I have cleared up a little bit" and discusses how he is no longer interested in any substance use treatment. Discussed option of outpatient substance use treatment; howeber, patient also declines this. Patient displays poor insight into his substance use. He states that he has been to rehabs in the past and indicates that he normally relapses shortly fter discharge. Pt states that this occurred because he was not interested in quitting. He states that he is now truly motivated to quit and would like to do so without any assistance. Patient reports drinking since he was 25 yo and states that the longest period of sobrity he has had has been 90 days which occuted when he was in a residental facility, however he relapsed shortly after discharge. Discussed outpatient follow up-pt declines IOP/PHP, residential, or outpatient substance use. Pt expresses that he has enjoyed the groups on the unit; however, when outpatient group therpy is dicussed; he states "I wouldn't go". Pt denies SI/HI/AVH   Diagnosis:  Final diagnoses:  Alcohol abuse with intoxication (HCC)    Total Time spent with patient: 15 minutes  Past Psychiatric History: AUD Past Medical History:  Past Medical History:  Diagnosis Date   ADHD (attention deficit hyperactivity disorder)    Sinus pressure    No past surgical history on file. Family History:  Family History  Problem Relation Age of Onset   Hypertension Maternal Grandfather    Hypertension Paternal Grandfather    Alcohol abuse Neg Hx    Arthritis Neg Hx    Asthma Neg Hx    Birth defects Neg Hx    Cancer Neg Hx    COPD Neg Hx    Depression Neg Hx    Diabetes Neg  Hx    Drug abuse Neg Hx    Early death Neg Hx    Hearing loss Neg Hx    Heart disease Neg Hx    Hyperlipidemia Neg Hx    Kidney disease Neg Hx    Learning disabilities Neg Hx    Mental illness Neg Hx    Mental retardation Neg Hx    Miscarriages / Stillbirths Neg Hx    Stroke Neg Hx    Vision loss Neg Hx    Varicose Veins Neg Hx    Family Psychiatric  History: see H&P Social History:  Social History   Substance and Sexual Activity  Alcohol Use Yes   Alcohol/week: 0.0 standard drinks   Comment: daily x 2-3 weeks     Social History   Substance and Sexual Activity  Drug Use Not Currently   Types: Marijuana   Comment: none x 1 week    Social History   Socioeconomic History   Marital status: Single    Spouse name: Not on file   Number of children: Not on file   Years of education: Not on file   Highest education level: Not on file  Occupational History   Not on file  Tobacco Use   Smoking status: Every Day    Types: Cigarettes   Smokeless tobacco: Never  Vaping Use   Vaping Use: Never used  Substance and Sexual Activity   Alcohol use: Yes  Alcohol/week: 0.0 standard drinks    Comment: daily x 2-3 weeks   Drug use: Not Currently    Types: Marijuana    Comment: none x 1 week   Sexual activity: Never  Other Topics Concern   Not on file  Social History Narrative   Not on file   Social Determinants of Health   Financial Resource Strain: Not on file  Food Insecurity: Not on file  Transportation Needs: Not on file  Physical Activity: Not on file  Stress: Not on file  Social Connections: Not on file   SDOH:  SDOH Screenings   Alcohol Screen: Not on file  Depression (PHQ2-9): Not on file  Financial Resource Strain: Not on file  Food Insecurity: Not on file  Housing: Not on file  Physical Activity: Not on file  Social Connections: Not on file  Stress: Not on file  Tobacco Use: High Risk   Smoking Tobacco Use: Every Day   Smokeless Tobacco Use: Never   Transportation Needs: Not on file   Additional Social History:    Pain Medications: See MAR Prescriptions: See MAR Over the Counter: See MAR History of alcohol / drug use?: Yes Name of Substance 1: Alcohol 1 - Age of First Use: 19 1 - Amount (size/oz): Varies 1 - Frequency: Varies 1 - Duration: Ongoing 1 - Last Use / Amount: Prior to arrival "3 beers"                  Sleep: Fair  Appetite:  Fair  Current Medications:  Current Facility-Administered Medications  Medication Dose Route Frequency Provider Last Rate Last Admin   acetaminophen (TYLENOL) tablet 650 mg  650 mg Oral Q6H PRN Ardis Hughs, NP       alum & mag hydroxide-simeth (MAALOX/MYLANTA) 200-200-20 MG/5ML suspension 30 mL  30 mL Oral Q4H PRN Ardis Hughs, NP       hydrOXYzine (ATARAX/VISTARIL) tablet 25 mg  25 mg Oral Q6H PRN Ardis Hughs, NP   25 mg at 05/13/21 1338   loperamide (IMODIUM) capsule 2-4 mg  2-4 mg Oral PRN Ardis Hughs, NP       LORazepam (ATIVAN) tablet 1 mg  1 mg Oral Q6H PRN Ardis Hughs, NP       magnesium hydroxide (MILK OF MAGNESIA) suspension 30 mL  30 mL Oral Daily PRN Ardis Hughs, NP       multivitamin with minerals tablet 1 tablet  1 tablet Oral Daily Ardis Hughs, NP   1 tablet at 05/14/21 0910   ondansetron (ZOFRAN-ODT) disintegrating tablet 4 mg  4 mg Oral Q6H PRN Ardis Hughs, NP       thiamine tablet 100 mg  100 mg Oral Daily Vernard Gambles H, NP   100 mg at 05/14/21 0910   traZODone (DESYREL) tablet 50 mg  50 mg Oral QHS PRN Ardis Hughs, NP   50 mg at 05/13/21 2116   No current outpatient medications on file.    Labs  Lab Results:  Admission on 05/13/2021  Component Date Value Ref Range Status   SARS Coronavirus 2 by RT PCR 05/13/2021 NEGATIVE  NEGATIVE Final   Comment: (NOTE) SARS-CoV-2 target nucleic acids are NOT DETECTED.  The SARS-CoV-2 RNA is generally detectable in upper respiratory specimens during the  acute phase of infection. The lowest concentration of SARS-CoV-2 viral copies this assay can detect is 138 copies/mL. A negative result does not preclude SARS-Cov-2 infection and should not  be used as the sole basis for treatment or other patient management decisions. A negative result may occur with  improper specimen collection/handling, submission of specimen other than nasopharyngeal swab, presence of viral mutation(s) within the areas targeted by this assay, and inadequate number of viral copies(<138 copies/mL). A negative result must be combined with clinical observations, patient history, and epidemiological information. The expected result is Negative.  Fact Sheet for Patients:  BloggerCourse.com  Fact Sheet for Healthcare Providers:  SeriousBroker.it  This test is no                          t yet approved or cleared by the [place] FDA and  has been authorized for detection and/or diagnosis of SARS-CoV-2 by FDA under an Emergency Use Authorization (EUA). This EUA will remain  in effect (meaning this test can be used) for the duration of the COVID-19 declaration under Section 564(b)(1) of the Act, 21 U.S.C.section 360bbb-3(b)(1), unless the authorization is terminated  or revoked sooner.       Influenza A by PCR 05/13/2021 NEGATIVE  NEGATIVE Final   Influenza B by PCR 05/13/2021 NEGATIVE  NEGATIVE Final   Comment: (NOTE) The Xpert Xpress SARS-CoV-2/FLU/RSV plus assay is intended as an aid in the diagnosis of influenza from Nasopharyngeal swab specimens and should not be used as a sole basis for treatment. Nasal washings and aspirates are unacceptable for Xpert Xpress SARS-CoV-2/FLU/RSV testing.  Fact Sheet for Patients: BloggerCourse.com  Fact Sheet for Healthcare Providers: SeriousBroker.it  This test is not yet approved or cleared by the [place] FDA  and has been authorized for detection and/or diagnosis of SARS-CoV-2 by FDA under an Emergency Use Authorization (EUA). This EUA will remain in effect (meaning this test can be used) for the duration of the COVID-19 declaration under Section 564(b)(1) of the Act, 21 U.S.C. section 360bbb-3(b)(1), unless the authorization is terminated or revoked.  Performed at [facility], [address], [place], [place] [address]    WBC 05/13/2021 5.2  4.0 - 10.5 K/uL Final   RBC 05/13/2021 4.23  4.22 - 5.81 MIL/uL Final   Hemoglobin 05/13/2021 14.3  13.0 - 17.0 g/dL Final   HCT 60/45/4098 42.1  39.0 - 52.0 % Final   MCV 05/13/2021 99.5  80.0 - 100.0 fL Final   MCH 05/13/2021 33.8  26.0 - 34.0 pg Final   MCHC 05/13/2021 34.0  30.0 - 36.0 g/dL Final   RDW 11/91/4782 11.7  11.5 - 15.5 % Final   Platelets 05/13/2021 306  150 - 400 K/uL Final   nRBC 05/13/2021 0.0  0.0 - 0.2 % Final   Neutrophils Relative % 05/13/2021 57  % Final   Neutro Abs 05/13/2021 2.9  1.7 - 7.7 K/uL Final   Lymphocytes Relative 05/13/2021 29  % Final   Lymphs Abs 05/13/2021 1.5  0.7 - 4.0 K/uL Final   Monocytes Relative 05/13/2021 11  % Final   Monocytes Absolute 05/13/2021 0.6  0.1 - 1.0 K/uL Final   Eosinophils Relative 05/13/2021 2  % Final   Eosinophils Absolute 05/13/2021 0.1  0.0 - 0.5 K/uL Final   Basophils Relative 05/13/2021 1  % Final   Basophils Absolute 05/13/2021 0.1  0.0 - 0.1 K/uL Final   Immature Granulocytes 05/13/2021 0  % Final   Abs Immature Granulocytes 05/13/2021 0.01  0.00 - 0.07 K/uL Final   Performed at [facility], [address] [place], [place]  [address]   Sodium 05/13/2021 138  135 - 145 mmol/L Final   Potassium 05/13/2021 3.4 (A) 3.5 - 5.1 mmol/L Final   Chloride 05/13/2021 103  98 - 111 mmol/L Final   CO2 05/13/2021 23  22 - 32 mmol/L Final   Glucose, Bld 05/13/2021 80  70 - 99 mg/dL Final   Glucose reference range applies only to samples taken after fasting for at least 8  hours.   BUN 05/13/2021 10  6 - 20 mg/dL Final   Creatinine, Ser 05/13/2021 0.60 (A) 0.61 - 1.24 mg/dL Final   Calcium 47/42/5956 9.3  8.9 - 10.3 mg/dL Final   Total Protein 38/75/6433 7.9  6.5 - 8.1 g/dL Final   Albumin 29/51/8841 4.4  3.5 - 5.0 g/dL Final   AST 66/02/3015 159 (A) 15 - 41 U/L Final   ALT 05/13/2021 123 (A) 0 - 44 U/L Final   Alkaline Phosphatase 05/13/2021 53  38 - 126 U/L Final   Total Bilirubin 05/13/2021 0.8  0.3 - 1.2 mg/dL Final   GFR, Estimated 05/13/2021 >60  >60 mL/min Final   Comment: (NOTE) Calculated using the CKD-EPI Creatinine Equation (2021)    Anion gap 05/13/2021 12  5 - 15 Final   Performed at [facility], [address], [place], [place] [address]   Hgb A1c MFr Bld 05/13/2021 5.1  4.8 - 5.6 % Final   Comment: (NOTE) Pre diabetes:          5.7%-6.4%  Diabetes:              >6.4%  Glycemic control for   <7.0% adults with diabetes    Mean Plasma Glucose 05/13/2021 99.67  mg/dL Final   Performed at [facility], [address], [place], [place] [address]   Magnesium 05/13/2021 1.9  1.7 - 2.4 mg/dL Final   Performed at [facility], [address] [place], [place] [address]   Alcohol, Ethyl (B) 05/13/2021 202 (A) <10 mg/dL Final   Comment: (NOTE) Lowest detectable limit for serum alcohol is 10 mg/dL.  For medical purposes only. Performed at [facility], [address] [place], [place] [address]    Cholesterol 05/13/2021 317 (A) 0 - 200 mg/dL Final   Triglycerides 62/37/6283 37  <150 mg/dL Final   HDL 15/17/6160 >135  >40 mg/dL Final   Total CHOL/HDL Ratio 05/13/2021 NOT CALCULATED  RATIO Final   VLDL 05/13/2021 7  0 - 40 mg/dL Final   LDL Cholesterol 05/13/2021 NOT CALCULATED  0 - 99 mg/dL Final   Performed at [facility], [address] [place], [place] [address]   TSH 05/13/2021 1.764  0.350 - 4.500 uIU/mL Final   Comment: Performed by a 3rd Generation assay with a functional sensitivity of <=0.01  uIU/mL. Performed at [facility], [address], [place], [place] [address]    Color, Urine 05/13/2021 STRAW (A) YELLOW Final   APPearance 05/13/2021 CLEAR  CLEAR Final   Specific Gravity, Urine 05/13/2021 1.008  1.005 - 1.030 Final   pH 05/13/2021 6.0  5.0 - 8.0 Final   Glucose, UA 05/13/2021 NEGATIVE  NEGATIVE mg/dL Final   Hgb urine dipstick 05/13/2021 NEGATIVE  NEGATIVE Final   Bilirubin Urine 05/13/2021 NEGATIVE  NEGATIVE Final   Ketones, ur 05/13/2021 NEGATIVE  NEGATIVE mg/dL Final   Protein, ur 85/46/2703 NEGATIVE  NEGATIVE mg/dL Final   Nitrite 50/05/3817 NEGATIVE  NEGATIVE Final   Leukocytes,Ua 05/13/2021 NEGATIVE  NEGATIVE Final   Bacteria, UA 05/13/2021 NONE SEEN  NONE SEEN Final   Performed at [facility], [address] [place], [place] [address]   POC Amphetamine UR 05/13/2021 None Detected  NONE DETECTED (Cut Off Level 1000 ng/mL) Final   POC Secobarbital (BAR) 05/13/2021 None Detected  NONE DETECTED (Cut Off Level 300 ng/mL) Final   POC Buprenorphine (BUP) 05/13/2021 None Detected  NONE DETECTED (Cut Off Level 10 ng/mL) Final   POC Oxazepam (BZO) 05/13/2021 None Detected  NONE DETECTED (Cut Off Level 300 ng/mL) Final   POC Cocaine UR 05/13/2021 None Detected  NONE DETECTED (Cut Off Level 300 ng/mL) Final   POC Methamphetamine UR 05/13/2021 None Detected  NONE DETECTED (Cut Off Level 1000 ng/mL) Final   POC Morphine 05/13/2021 None Detected  NONE DETECTED (Cut Off Level 300 ng/mL) Final   POC Oxycodone UR 05/13/2021 None Detected  NONE DETECTED (Cut Off Level 100 ng/mL) Final   POC Methadone UR 05/13/2021 None Detected  NONE DETECTED (Cut Off Level 300 ng/mL) Final   POC Marijuana UR 05/13/2021 Positive (A) NONE DETECTED (Cut Off Level 50 ng/mL) Final   RPR Ser Ql 05/13/2021 NON REACTIVE  NON REACTIVE Final   Performed at [facility], [address] [place], [place] [address]   Hepatitis B Surface Ag 05/13/2021 NON REACTIVE  NON REACTIVE Final   HCV  Ab 05/13/2021 NON REACTIVE  NON REACTIVE Final   Comment: (NOTE) Nonreactive HCV antibody screen is consistent with no HCV infections,  unless recent infection is suspected or other evidence exists to indicate HCV infection.     Hep A IgM 05/13/2021 NON REACTIVE  NON REACTIVE Final   Hep B C IgM 05/13/2021 NON REACTIVE  NON REACTIVE Final   Performed at [facility], [address] [place], [place] [address]   HIV Screen 4th Generation wRfx 05/13/2021 Non Reactive  Non Reactive Final   Performed at [facility], [address] [place], [place] [address]   SARS Coronavirus 2 Ag 05/13/2021 Negative  Negative Final   SARSCOV2ONAVIRUS 2 AG 05/13/2021 NEGATIVE  NEGATIVE Final   Comment: (NOTE) SARS-CoV-2 antigen NOT DETECTED.   Negative results are presumptive.  Negative results do not preclude SARS-CoV-2 infection and should not be used as the sole basis for treatment or other patient management decisions, including infection  control decisions, particularly in the presence of clinical signs and  symptoms consistent with COVID-19, or in those who have been in contact with the virus.  Negative results must be combined with clinical observations, patient history, and epidemiological information. The expected result is Negative.  Fact Sheet for Patients: https://www.jennings-kim.com/https://www.fda.gov/media/141569/download  Fact Sheet for Healthcare Providers: https://alexander-rogers.biz/https://www.fda.gov/media/141568/download  This test is not yet approved or cleared by the [place] FDA and  has been authorized for detection and/or diagnosis of SARS-CoV-2 by FDA under an Emergency Use Authorization (EUA).  This EUA will remain in effect (meaning this test can be used) for the duration of  the COV                          ID-19 declaration under Section 564(b)(1) of the Act, 21 U.S.C. section 360bbb-3(b)(1), unless the authorization is terminated or revoked sooner.    Admission on 12/22/2020, Discharged on 12/22/2020   Component Date Value Ref Range Status   Lipase 12/22/2020 32  11 - 51 U/L Final   Performed at [facility], [address], [place], [place] [address]   Sodium 12/22/2020 140  135 - 145 mmol/L Final   Potassium 12/22/2020 3.8  3.5 - 5.1 mmol/L Final   Chloride 12/22/2020 106  98 - 111 mmol/L Final   CO2 12/22/2020 22  22 - 32 mmol/L Final   Glucose, Bld 12/22/2020 115 (A) 70 - 99 mg/dL Final   Glucose reference range applies only to samples taken after fasting for at least 8 hours.   BUN 12/22/2020 7  6 - 20 mg/dL Final   Creatinine, Ser 12/22/2020 0.67  0.61 - 1.24 mg/dL Final   Calcium 45/80/9983 8.6 (A) 8.9 - 10.3 mg/dL Final   Total Protein 38/25/0539 7.2  6.5 - 8.1 g/dL Final   Albumin 76/73/4193 4.1  3.5 - 5.0 g/dL Final   AST 79/10/4095 56 (A) 15 - 41 U/L Final   ALT 12/22/2020 31  0 - 44 U/L Final   Alkaline Phosphatase 12/22/2020 51  38 - 126 U/L Final   Total Bilirubin 12/22/2020 0.7  0.3 - 1.2 mg/dL Final   GFR, Estimated 12/22/2020 >60  >60 mL/min Final   Comment: (NOTE) Calculated using the CKD-EPI Creatinine Equation (2021)    Anion gap 12/22/2020 12  5 - 15 Final   Performed at [facility], [address], [place], [place] [address]   WBC 12/22/2020 7.6  4.0 - 10.5 K/uL Final   RBC 12/22/2020 4.32  4.22 - 5.81 MIL/uL Final   Hemoglobin 12/22/2020 14.0  13.0 - 17.0 g/dL Final   HCT 92/42/6834 39.3  39.0 - 52.0 % Final   MCV 12/22/2020 91.0  80.0 - 100.0 fL Final   MCH 12/22/2020 32.4  26.0 - 34.0 pg Final   MCHC 12/22/2020 35.6  30.0 - 36.0 g/dL Final   RDW 19/62/2297 11.3 (A) 11.5 - 15.5 % Final   Platelets 12/22/2020 360  150 - 400 K/uL Final   nRBC 12/22/2020 0.0  0.0 - 0.2 % Final   Performed at [facility], [address], [place], [place] [address]   Color, Urine 12/22/2020 COLORLESS (A) YELLOW Final   APPearance 12/22/2020 CLEAR  CLEAR Final   Specific Gravity, Urine 12/22/2020 1.004 (A) 1.005 -  1.030 Final   pH 12/22/2020 7.0  5.0 - 8.0 Final   Glucose, UA 12/22/2020 NEGATIVE  NEGATIVE mg/dL Final   Hgb urine dipstick 12/22/2020 NEGATIVE  NEGATIVE Final   Bilirubin Urine 12/22/2020 NEGATIVE  NEGATIVE Final   Ketones, ur 12/22/2020 NEGATIVE  NEGATIVE mg/dL Final   Protein, ur 19/41/7408 NEGATIVE  NEGATIVE mg/dL Final   Nitrite 14/48/1856 NEGATIVE  NEGATIVE Final   Leukocytes,Ua 12/22/2020 NEGATIVE  NEGATIVE Final   Performed at [facility], [address], [place], [place] [address]    Blood Alcohol level:  Lab Results  Component Value Date   ETH 202 (H) 05/13/2021   ETH <10 11/22/2018    Metabolic Disorder Labs: Lab Results  Component Value Date   HGBA1C 5.1 05/13/2021   MPG 99.67 05/13/2021   No results found for: PROLACTIN Lab Results  Component Value Date   CHOL 317 (H) 05/13/2021   TRIG 37 05/13/2021   HDL >135 05/13/2021   CHOLHDL NOT CALCULATED 05/13/2021   VLDL 7 05/13/2021   LDLCALC NOT CALCULATED 05/13/2021    Therapeutic Lab Levels: No results found for: LITHIUM No results found for: VALPROATE No components found for:  CBMZ  Physical Findings   PHQ2-9  Flowsheet Row Office Visit from 03/03/2016 in [facility] Office Visit from 03/14/2015 in [facility]  PHQ-2 Total Score 0 0  PHQ-9 Total Score 1 0      Flowsheet Row ED from 05/13/2021 in [facility] ED from 12/22/2020 in [facility]  C-SSRS RISK CATEGORY No Risk No Risk        Musculoskeletal  Strength & Muscle Tone: within normal limits Gait & Station: normal Patient leans: N/A  Psychiatric Specialty Exam  Presentation  General Appearance: Appropriate for Environment; Casual  Eye Contact:Good  Speech:Clear and Coherent; Normal Rate  Speech Volume:Normal  Handedness:Right   Mood and Affect  Mood:Euthymic  Affect:Appropriate; Congruent   Thought Process  Thought  Processes:Coherent; Goal Directed; Linear  Descriptions of Associations:Intact  Orientation:Full (Time, Place and Person)  Thought Content:WDL; Logical  Diagnosis of Schizophrenia or Schizoaffective disorder in past: No    Hallucinations:Hallucinations: None  Ideas of Reference:None  Suicidal Thoughts:Suicidal Thoughts: No  Homicidal Thoughts:Homicidal Thoughts: No   Sensorium  Memory:Immediate Good; Recent Good; Remote Good  Judgment:Fair  Insight:Fair; Other (comment) (generally fair insight; however insight is lacking in regards to substance use)   Executive Functions  Concentration:Good  Attention Span:Good  Recall:Good  Fund of Knowledge:Good  Language:Good   Psychomotor Activity  Psychomotor Activity:Psychomotor Activity: Normal   Assets  Assets:Communication Skills; Desire for Improvement; Financial Resources/Insurance; Housing; Physical Health; Resilience; Social Support   Sleep  Sleep:Sleep: Fair   No data recorded  Physical Exam  Physical Exam Constitutional:      Appearance: Normal appearance. He is normal weight.  HENT:     Head: Normocephalic and atraumatic.  Eyes:     Extraocular Movements: Extraocular movements intact.  Pulmonary:     Effort: Pulmonary effort is normal.  Neurological:     General: No focal deficit present.     Mental Status: He is alert and oriented to person, place, and time.  Psychiatric:        Attention and Perception: Attention and perception normal.        Speech: Speech normal.        Behavior: Behavior normal. Behavior is cooperative.        Thought Content: Thought content normal.   Review of Systems  Constitutional:  Negative for chills and fever.  HENT:  Negative for hearing loss.   Eyes:  Negative for discharge and redness.  Respiratory:  Negative for cough.   Cardiovascular:  Negative for chest pain.  Gastrointestinal:  Negative for abdominal pain.  Musculoskeletal:  Negative for myalgias.   Neurological:  Negative for headaches.  Psychiatric/Behavioral:  Positive for substance abuse. Negative for depression, hallucinations and suicidal ideas. The patient is not nervous/anxious.   Blood pressure 115/82, pulse 61, temperature 97.9 F (36.6 C), temperature source Oral, resp. rate 18, SpO2 100 %. There is no height or weight on file to calculate BMI.  Treatment Plan Summary: 25 yo male with history of alcohol abuse who presented to the [facility] voluntarily  for assistance with rehab on 05/13/21. Pt was admitted to the [facility] for asssitance with detox and to initiate inpatient rehab placement. Etoh 202; UDS+marijuana. CIWA protocol initiated, CIWA of 2 yesterday; today CIWA 0. K mildly low at 3.4; 40 meq potassium ordered yesterday, Today, patient states that he is no longer interested in rehab or any outpatient substance use treatment. Patient displays poor insight into his alcohol use. Attempted to engage patient in motivational interviewing-patient would not engage and  appears to be stage of pre-contemplation.  Alcohol use disorder -continue detox protocol -multivitamin -poor insight into substance use-declining treatment at this time -PRNs for alcohol withdrawal-see MAR for complete list  Dispo: ongoing-possibly tomorrow if continues to decline services.  likely home with outpatient resources. Patient no longer interested in residential substance use treatment. SW assisting   Estella Husk, MD 05/14/2021 2:47 PM

## 2021-05-14 NOTE — ED Notes (Signed)
Pt sleeping in no acute distress. RR even and unlabored. Safety maintained. 

## 2021-05-14 NOTE — Clinical Social Work Psych Note (Addendum)
CSW Update  CSW met with patient for introduction and to begin discussions regarding discharge planning.   Kyle Webb shared that he came to the [facility] seeking help with his alcohol use issues, however he currently does not feel like it is an issue that needs attention at this time. Kyle Webb reports that he has self medicated his anxiety and depressive symptoms with alcohol since the age of 17.   Kyle Webb reports instead of residential substance use treatment, he believes participating in outpatient therapy services would be more beneficial in his road to recovery.   Kyle Webb declined an referrals for potential placement at a residential substance abuse program; However he was agreeable with substance abuse resources at discharge.   Kyle Webb reports he would like resources for outpatient therapy in his discharge paperwork. Kyle Webb reports he plans to discharge tomorrow, and return home with family in the [place] area. Kyle Webb requested CSW not to obtain an actual appointment, as he would establish care once he discharges.   He denied having any additional questions or concerns at this time.     Radonna Ricker, MSW, LCSW Clinical Education officer, museum ([place]) [facility]

## 2021-05-14 NOTE — ED Notes (Signed)
Pt actively participating in group

## 2021-05-14 NOTE — Clinical Social Work Psych Note (Signed)
Anxiety  Diagnosis: Anxiety (Psychoeducational)  Date: 05/14/21  Type of Therapy/Therapeutic Modalities: Group Discussion, Psycho-Education  Participation Level: Active  Objective: The purpose of the group is to educate patients on the various components of anxiety and how it can influence and/or contribute to the inappropriate or disproportionate responses to perceived threats, leading to persistent and intrusive symptoms associated with different anxiety disorders.   Therapeutic Goals:  Patient will learn the foundations of anxiety, including its definition and the various types of anxiety disorders.  Patient will discuss with group members their personal experiences with anxiety and how it has impacted their lives  Patient will learn various distress tolerance and relaxation techniques that are effective in treating anxiety symptoms.  Patient will discuss with group members how they plan to address and/or maintain their anxiety symptoms moving forward.   Summary of Patient's Progress:  Kyle Webb was engaged and participated throughout the group session. Kyle Webb states that he has experienced two panic attacks in his life, however he did not identify a specific trigger associated with them. He also shared that his anxiety has effected his social life in a negative manner over the years.

## 2021-05-14 NOTE — ED Notes (Signed)
Patient resting in bed with eyes closed. Respirations even and non labored. No distress noted. Patient remains safe on unit and monitoring continues.

## 2021-05-15 DIAGNOSIS — F1020 Alcohol dependence, uncomplicated: Secondary | ICD-10-CM

## 2021-05-15 NOTE — ED Notes (Signed)
Pt in bed sleeping, respirations are even/unlabored, environment check complete/secure, will continue to monitor patient for safety 

## 2021-05-15 NOTE — Discharge Instructions (Signed)

## 2021-05-15 NOTE — ED Notes (Signed)
Patient A&O x 4, ambulatory. Patient discharged in no acute distress. Patient denied SI/HI, A/VH upon discharge. Denies sx of withdrawal from ETOH. Patient verbalized understanding of all discharge instructions explained by staff, to include follow up appointments and safety plan. Pt belongings returned to patient from locker #32 intact. Patient escorted to lobby via staff for transport to home via patients mother. Safety maintained.

## 2021-05-15 NOTE — ED Notes (Signed)
Patient participated in wrap up group this evening and only wanted to say that he had a good day and is looking forward to tomorrow and will no longer worry about others In life . Patient then went back to his room.

## 2021-05-15 NOTE — ED Provider Notes (Signed)
[facility] Discharge Suicide Risk Assessment   Principal Problem: Alcohol abuse with intoxication (HCC) Discharge Diagnoses: Principal Problem:   Alcohol abuse with intoxication (HCC) Active Problems:   Alcohol use disorder, severe, in controlled environment (HCC)   Total Time spent with patient: 15 minutes  Musculoskeletal: Strength & Muscle Tone: within normal limits Gait & Station: normal Patient leans: N/A  Psychiatric Specialty Exam  Presentation  General Appearance: Appropriate for Environment; Casual  Eye Contact:Good  Speech:Clear and Coherent; Normal Rate  Speech Volume:Normal  Handedness:Right   Mood and Affect  Mood:Euthymic  Duration of Depression Symptoms: Greater than two weeks  Affect:Appropriate; Congruent   Thought Process  Thought Processes:Coherent; Goal Directed; Linear  Descriptions of Associations:Intact  Orientation:Full (Time, Place and Person)  Thought Content:WDL; Logical  History of Schizophrenia/Schizoaffective disorder:No  Duration of Psychotic Symptoms:No data recorded Hallucinations:Hallucinations: None  Ideas of Reference:None  Suicidal Thoughts:Suicidal Thoughts: No  Homicidal Thoughts:Homicidal Thoughts: No   Sensorium  Memory:Immediate Good; Recent Good; Remote Good  Judgment:Fair  Insight:Fair (generally fair insight; however, poor insight in regards to substance use)   Executive Functions  Concentration:Good  Attention Span:Good  Recall:Good  Fund of Knowledge:Good  Language:Good   Psychomotor Activity  Psychomotor Activity:Psychomotor Activity: Normal   Assets  Assets:Communication Skills; Desire for Improvement; Financial Resources/Insurance; Housing; Physical Health; Resilience; Social Support   Sleep  Sleep:Sleep: Good   Physical Exam: Physical Exam Constitutional:      Appearance: Normal appearance. He is normal weight.  HENT:     Head: Normocephalic and atraumatic.  Eyes:      Extraocular Movements: Extraocular movements intact.  Pulmonary:     Effort: Pulmonary effort is normal.  Neurological:     General: No focal deficit present.     Mental Status: He is alert and oriented to person, place, and time.   Review of Systems  Constitutional:  Negative for chills and fever.  HENT:  Negative for hearing loss.   Eyes:  Negative for discharge and redness.  Respiratory:  Negative for cough.   Cardiovascular:  Negative for chest pain.  Gastrointestinal:  Negative for abdominal pain.  Musculoskeletal:  Negative for myalgias.  Neurological:  Negative for headaches.  Psychiatric/Behavioral:  Positive for substance abuse. Negative for depression, hallucinations and suicidal ideas. The patient is not nervous/anxious.   Blood pressure 112/86, pulse 70, temperature 98.8 F (37.1 C), temperature source Oral, resp. rate 18, SpO2 100 %. There is no height or weight on file to calculate BMI.  Mental Status Per Nursing Assessment::   On Admission:   no SI; has denied SI entirety of stay at the [facility]  Demographic Factors:  Male, Adolescent or young adult, Caucasian, and Unemployed  Loss Factors: NA  Historical Factors: Substance use  Risk Reduction Factors:   Living with another person, especially a relative and Positive social support  Continued Clinical Symptoms:  Alcohol/Substance Abuse/Dependencies  Cognitive Features That Contribute To Risk:  Thought constriction (tunnel vision)    Suicide Risk:  Minimal: No identifiable suicidal ideation.  Patients presenting with no risk factors but with morbid ruminations; may be classified as minimal risk based on the severity of the depressive symptoms   Follow-up Information     [facility]. Call.   Specialty: Behavioral Health Why: Please contact to establish any outpatient psychiatric treatment services such as outpatient therapy and medication management services. Please be  sure to have any discharge paperwork from this encounter, inlcuding a list of medications. Contact information: [address] [place] [address] [phone]        [facility]. Call.   Specialty: Behavioral Health Why: Please contact to inquire about OUTPATIENT substance use services. Please be sure to have any discharge paperwork from this encounter, inlcuding a list of medications. Contact information: [address] [place] [address] [phone]         [facility] Call.   Why: Please contact to inquire about outpatient and residential substance use services.   Please be sure to have any discharge paperwork from this encounter, inlcuding a list of medications. Contact information: [address] [place] [address] [phone]         [facility] Follow up.   Why: Please contact to inquire about residential substance use treatment services.   Please be sure to have any discharge paperwork from this encounter, inlcuding a list of medications. Contact information: [address] [place] [address] [phone]                 Plan Of Care/Follow-up recommendations:  Activity:  as tolerated Diet:  regular Other:     Patient is instructed prior to discharge to: Take all medications as prescribed by his/her mental healthcare provider. Report any adverse effects and or reactions from the medicines to his/her outpatient provider promptly. Patient has been instructed & cautioned: To not engage in alcohol and or illegal drug use while on prescription medicines. In the event of worsening symptoms, patient is instructed to call the crisis hotline, 911 and or go to the nearest ED for appropriate evaluation and treatment of symptoms. To follow-up with his/her primary care provider for your other medical issues, concerns and or health care needs.      Estella Husk, MD 05/15/2021,  3:07 PM

## 2021-05-15 NOTE — Clinical Social Work Psych Note (Signed)
CSW Discharge Note    CSW met with patient to discuss his discharge.   Kyle Webb reports that he is ready to discharge and that he "Feels much better".   Kyle Webb declined any referrals for substance abuse treatment, however he was agreeable with additional resources. Kyle Webb was handed a Futures trader. Resources were listed in the patient's AVS as well.   Kyle Webb reports he plans to discharge home with his mother, who is picking him up.   Kyle Webb denied having any additional needs or concerns.   CSW signing off.     Radonna Ricker, MSW, LCSW Clinical Education officer, museum ([place]) [facility]

## 2021-05-15 NOTE — ED Provider Notes (Signed)
FBC/OBS ASAP Discharge Summary  Date and Time: 05/15/2021 2:32 PM  Name: Kyle Webb  MRN:  [id number]   Discharge Diagnoses:  Final diagnoses:  Alcohol abuse with intoxication (HCC)  Alcohol use disorder, severe, in controlled environment [facility])    Subjective: Patient seen and chart reviewed. He has not received any PRN medication for alcohol withdrawal during his stay at [facility] and CIWA yesterday and today has been 0. He denies SI/HI/AVH. No alcohol withdrawal symptoms reported.  Discussed that inpatient rehab would likely be beneficial; however, patient minimizes his substance use and states that since this is the first time he is serious about sobriety he does not need assistance. He has been to rehab multiple times in the past and relapsed shortly after discharge. Discussed with patient that resources for residential rehab and outpatient rehab options will be provided on discharge. Pt verbalized understanding.  Stay Summary:  Patient presented to the [facility] on 9/5 with complaint of wanting alcohol detox. Etoh 202 on presentation, UDS+THC. Patient was admitted to [facility] and placed on detox protocol. CIWA was <10 throughout stay and he did not receive any PRNS for alcohol withdrawal. He attended groups throughout stay and was not a management problem. Patient was initially interested in residential substance abuse treatment but then declined requesting to be discharged without substance use treatment. Despite not being interested in substance use treatment, he was discharged with resources in the event he changes his mind. He denies SI/HI/AVH throughout stay.   Total Time spent with patient: 15 minutes  Past Psychiatric History: AUD Past Medical History:  Past Medical History:  Diagnosis Date   ADHD (attention deficit hyperactivity disorder)    Sinus pressure    No past surgical history on file. Family History:  Family History  Problem Relation Age of Onset   Hypertension Maternal  Grandfather    Hypertension Paternal Grandfather    Alcohol abuse Neg Hx    Arthritis Neg Hx    Asthma Neg Hx    Birth defects Neg Hx    Cancer Neg Hx    COPD Neg Hx    Depression Neg Hx    Diabetes Neg Hx    Drug abuse Neg Hx    Early death Neg Hx    Hearing loss Neg Hx    Heart disease Neg Hx    Hyperlipidemia Neg Hx    Kidney disease Neg Hx    Learning disabilities Neg Hx    Mental illness Neg Hx    Mental retardation Neg Hx    Miscarriages / Stillbirths Neg Hx    Stroke Neg Hx    Vision loss Neg Hx    Varicose Veins Neg Hx    Family Psychiatric History: see H&P Social History:  Social History   Substance and Sexual Activity  Alcohol Use Yes   Alcohol/week: 0.0 standard drinks   Comment: daily x 2-3 weeks     Social History   Substance and Sexual Activity  Drug Use Not Currently   Types: Marijuana   Comment: none x 1 week    Social History   Socioeconomic History   Marital status: Single    Spouse name: Not on file   Number of children: Not on file   Years of education: Not on file   Highest education level: Not on file  Occupational History   Not on file  Tobacco Use   Smoking status: Every Day    Types: Cigarettes   Smokeless tobacco: Never  Vaping Use   Vaping Use: Never used  Substance and Sexual Activity   Alcohol use: Yes    Alcohol/week: 0.0 standard drinks    Comment: daily x 2-3 weeks   Drug use: Not Currently    Types: Marijuana    Comment: none x 1 week   Sexual activity: Never  Other Topics Concern   Not on file  Social History Narrative   Not on file   Social Determinants of Health   Financial Resource Strain: Not on file  Food Insecurity: Not on file  Transportation Needs: Not on file  Physical Activity: Not on file  Stress: Not on file  Social Connections: Not on file   SDOH:  SDOH Screenings   Alcohol Screen: Not on file  Depression (PHQ2-9): Not on file  Financial Resource Strain: Not on file  Food Insecurity: Not  on file  Housing: Not on file  Physical Activity: Not on file  Social Connections: Not on file  Stress: Not on file  Tobacco Use: High Risk   Smoking Tobacco Use: Every Day   Smokeless Tobacco Use: Never  Transportation Needs: Not on file    Tobacco Cessation:  Prescription not provided because: n/a  Current Medications:  Current Facility-Administered Medications  Medication Dose Route Frequency Provider Last Rate Last Admin   acetaminophen (TYLENOL) tablet 650 mg  650 mg Oral Q6H PRN Ardis Hughs, NP       alum & mag hydroxide-simeth (MAALOX/MYLANTA) 200-200-20 MG/5ML suspension 30 mL  30 mL Oral Q4H PRN Ardis Hughs, NP       hydrOXYzine (ATARAX/VISTARIL) tablet 25 mg  25 mg Oral Q6H PRN Ardis Hughs, NP   25 mg at 05/13/21 1338   loperamide (IMODIUM) capsule 2-4 mg  2-4 mg Oral PRN Ardis Hughs, NP       LORazepam (ATIVAN) tablet 1 mg  1 mg Oral Q6H PRN Ardis Hughs, NP       magnesium hydroxide (MILK OF MAGNESIA) suspension 30 mL  30 mL Oral Daily PRN Ardis Hughs, NP       multivitamin with minerals tablet 1 tablet  1 tablet Oral Daily Ardis Hughs, NP   1 tablet at 05/15/21 0932   ondansetron (ZOFRAN-ODT) disintegrating tablet 4 mg  4 mg Oral Q6H PRN Ardis Hughs, NP       thiamine tablet 100 mg  100 mg Oral Daily Vernard Gambles H, NP   100 mg at 05/15/21 0932   traZODone (DESYREL) tablet 50 mg  50 mg Oral QHS PRN Ardis Hughs, NP   50 mg at 05/13/21 2116   No current outpatient medications on file.    PTA Medications: (Not in a hospital admission)   Musculoskeletal  Strength & Muscle Tone: within normal limits Gait & Station: normal Patient leans: N/A  Psychiatric Specialty Exam  Presentation  General Appearance: Appropriate for Environment; Casual  Eye Contact:Good  Speech:Clear and Coherent; Normal Rate  Speech Volume:Normal  Handedness:Right   Mood and Affect  Mood:Euthymic  Affect:Appropriate;  Congruent   Thought Process  Thought Processes:Coherent; Goal Directed; Linear  Descriptions of Associations:Intact  Orientation:Full (Time, Place and Person)  Thought Content:WDL; Logical  Diagnosis of Schizophrenia or Schizoaffective disorder in past: No    Hallucinations:Hallucinations: None  Ideas of Reference:None  Suicidal Thoughts:Suicidal Thoughts: No  Homicidal Thoughts:Homicidal Thoughts: No   Sensorium  Memory:Immediate Good; Recent Good; Remote Good  Judgment:Fair  Insight:Fair (generally fair insight; however, poor insight  in regards to substance use)   Executive Functions  Concentration:Good  Attention Span:Good  Recall:Good  Fund of Knowledge:Good  Language:Good   Psychomotor Activity  Psychomotor Activity:Psychomotor Activity: Normal   Assets  Assets:Communication Skills; Desire for Improvement; Financial Resources/Insurance; Housing; Physical Health; Resilience; Social Support   Sleep  Sleep:Sleep: Good   No data recorded  Physical Exam  See SRA for physical exam and ROS  Blood pressure 112/86, pulse 70, temperature 98.8 F (37.1 C), temperature source Oral, resp. rate 18, SpO2 100 %. There is no height or weight on file to calculate BMI.  See SRA for full suicide risk assessment  Plan Of Care/Follow-up recommendations:  Activity:  as tolerated Diet:  regular Other:    Patient is instructed prior to discharge to: Take all medications as prescribed by his/her mental healthcare provider. Report any adverse effects and or reactions from the medicines to his/her outpatient provider promptly. Patient has been instructed & cautioned: To not engage in alcohol and or illegal drug use while on prescription medicines. In the event of worsening symptoms, patient is instructed to call the crisis hotline, 911 and or go to the nearest ED for appropriate evaluation and treatment of symptoms. To follow-up with his/her primary care provider for  your other medical issues, concerns and or health care needs.     Disposition: home with parents  Estella Husk, MD 05/15/2021, 2:32 PM

## 2021-05-15 NOTE — ED Notes (Signed)
Pt sleeping in no acute distress. RR even and unlabored. Safety maintained. 

## 2021-05-15 NOTE — ED Notes (Addendum)
D:  Patient A&Ox4. Denies intent to harm self/others when asked. Denies A/VH. Denies withdrawal sx from etoh. Patient denies any physical complaints when asked. No acute distress noted.    A: Support and encouragement provided. Routine safety checks conducted according to facility protocol. Encouraged patient to notify staff if thoughts of harm toward self or others arise. Patient verbalize understanding and agreement.   R: Patient remains safe and patient verbally contracts for safety at this time. Will continue to monitor.

## 2021-05-19 ENCOUNTER — Encounter (HOSPITAL_BASED_OUTPATIENT_CLINIC_OR_DEPARTMENT_OTHER): Payer: Self-pay

## 2021-05-19 ENCOUNTER — Other Ambulatory Visit: Payer: Self-pay

## 2021-05-19 ENCOUNTER — Emergency Department (HOSPITAL_BASED_OUTPATIENT_CLINIC_OR_DEPARTMENT_OTHER): Payer: BC Managed Care – PPO

## 2021-05-19 ENCOUNTER — Emergency Department (HOSPITAL_BASED_OUTPATIENT_CLINIC_OR_DEPARTMENT_OTHER)
Admission: EM | Admit: 2021-05-19 | Discharge: 2021-05-19 | Disposition: A | Discharge status: Home / Self Care | Payer: BC Managed Care – PPO | Attending: Emergency Medicine | Admitting: Emergency Medicine

## 2021-05-19 DIAGNOSIS — S060X0A Concussion without loss of consciousness, initial encounter: Secondary | ICD-10-CM | POA: Insufficient documentation

## 2021-05-19 DIAGNOSIS — S0081XA Abrasion of other part of head, initial encounter: Secondary | ICD-10-CM | POA: Diagnosis not present

## 2021-05-19 DIAGNOSIS — R4182 Altered mental status, unspecified: Secondary | ICD-10-CM | POA: Diagnosis not present

## 2021-05-19 DIAGNOSIS — F17210 Nicotine dependence, cigarettes, uncomplicated: Secondary | ICD-10-CM | POA: Insufficient documentation

## 2021-05-19 DIAGNOSIS — S0990XA Unspecified injury of head, initial encounter: Secondary | ICD-10-CM | POA: Diagnosis not present

## 2021-05-19 DIAGNOSIS — Y9351 Activity, roller skating (inline) and skateboarding: Secondary | ICD-10-CM | POA: Diagnosis not present

## 2021-05-19 MED ORDER — ACETAMINOPHEN 325 MG PO TABS
650.0000 mg | ORAL_TABLET | Freq: Once | ORAL | Status: AC
  Administered 2021-05-19: 650 mg via ORAL
  Filled 2021-05-19: qty 2

## 2021-05-19 MED ORDER — IBUPROFEN 400 MG PO TABS
600.0000 mg | ORAL_TABLET | Freq: Once | ORAL | Status: DC
  Filled 2021-05-19: qty 1
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
## 2021-05-19 NOTE — ED Triage Notes (Signed)
He is here with c/o fell from skateboard at ~ 0000 hours today. He c/o striking his head "real hard" [sic]. He is alert and oriented x 4 with clear speech. A male friend is with him.

## 2021-05-28 DIAGNOSIS — F1020 Alcohol dependence, uncomplicated: Secondary | ICD-10-CM | POA: Diagnosis not present

## 2021-05-28 DIAGNOSIS — F419 Anxiety disorder, unspecified: Secondary | ICD-10-CM | POA: Diagnosis not present

## 2021-06-02 DIAGNOSIS — F1020 Alcohol dependence, uncomplicated: Secondary | ICD-10-CM | POA: Diagnosis not present

## 2021-06-02 DIAGNOSIS — F419 Anxiety disorder, unspecified: Secondary | ICD-10-CM | POA: Diagnosis not present

## 2021-06-03 DIAGNOSIS — F419 Anxiety disorder, unspecified: Secondary | ICD-10-CM | POA: Diagnosis not present

## 2021-06-03 DIAGNOSIS — F1020 Alcohol dependence, uncomplicated: Secondary | ICD-10-CM | POA: Diagnosis not present

## 2021-06-04 DIAGNOSIS — F419 Anxiety disorder, unspecified: Secondary | ICD-10-CM | POA: Diagnosis not present

## 2021-06-04 DIAGNOSIS — F1020 Alcohol dependence, uncomplicated: Secondary | ICD-10-CM | POA: Diagnosis not present

## 2021-06-05 DIAGNOSIS — F419 Anxiety disorder, unspecified: Secondary | ICD-10-CM | POA: Diagnosis not present

## 2021-06-05 DIAGNOSIS — F1020 Alcohol dependence, uncomplicated: Secondary | ICD-10-CM | POA: Diagnosis not present

## 2021-06-06 DIAGNOSIS — F419 Anxiety disorder, unspecified: Secondary | ICD-10-CM | POA: Diagnosis not present

## 2021-06-06 DIAGNOSIS — F1020 Alcohol dependence, uncomplicated: Secondary | ICD-10-CM | POA: Diagnosis not present

## 2021-06-07 DIAGNOSIS — F419 Anxiety disorder, unspecified: Secondary | ICD-10-CM | POA: Diagnosis not present

## 2021-06-07 DIAGNOSIS — F1020 Alcohol dependence, uncomplicated: Secondary | ICD-10-CM | POA: Diagnosis not present

## 2021-06-08 DIAGNOSIS — F1020 Alcohol dependence, uncomplicated: Secondary | ICD-10-CM | POA: Diagnosis not present

## 2021-06-08 DIAGNOSIS — F419 Anxiety disorder, unspecified: Secondary | ICD-10-CM | POA: Diagnosis not present

## 2021-06-09 DIAGNOSIS — F1020 Alcohol dependence, uncomplicated: Secondary | ICD-10-CM | POA: Diagnosis not present

## 2021-06-09 DIAGNOSIS — F419 Anxiety disorder, unspecified: Secondary | ICD-10-CM | POA: Diagnosis not present

## 2021-06-10 DIAGNOSIS — F1020 Alcohol dependence, uncomplicated: Secondary | ICD-10-CM | POA: Diagnosis not present

## 2021-06-10 DIAGNOSIS — F419 Anxiety disorder, unspecified: Secondary | ICD-10-CM | POA: Diagnosis not present

## 2021-06-14 ENCOUNTER — Ambulatory Visit (HOSPITAL_COMMUNITY)
Admission: EM | Admit: 2021-06-14 | Discharge: 2021-06-14 | Disposition: A | Discharge status: Home / Self Care | Payer: BC Managed Care – PPO

## 2021-06-14 ENCOUNTER — Other Ambulatory Visit: Payer: Self-pay

## 2022-01-06 DIAGNOSIS — F1019 Alcohol abuse with unspecified alcohol-induced disorder: Secondary | ICD-10-CM | POA: Diagnosis not present

## 2022-01-06 DIAGNOSIS — F411 Generalized anxiety disorder: Secondary | ICD-10-CM | POA: Diagnosis not present

## 2022-01-06 DIAGNOSIS — Z79899 Other long term (current) drug therapy: Secondary | ICD-10-CM | POA: Diagnosis not present

## 2022-05-21 ENCOUNTER — Ambulatory Visit (INDEPENDENT_AMBULATORY_CARE_PROVIDER_SITE_OTHER): Payer: BC Managed Care – PPO | Admitting: Internal Medicine

## 2022-05-21 ENCOUNTER — Encounter: Payer: Self-pay | Admitting: Internal Medicine

## 2022-05-21 VITALS — BP 120/90 | HR 84 | Temp 98.2°F | Resp 14 | Ht 68.0 in | Wt 168.2 lb

## 2022-05-21 DIAGNOSIS — F900 Attention-deficit hyperactivity disorder, predominantly inattentive type: Secondary | ICD-10-CM

## 2022-05-21 DIAGNOSIS — F1020 Alcohol dependence, uncomplicated: Secondary | ICD-10-CM | POA: Diagnosis not present

## 2022-05-21 DIAGNOSIS — Z0000 Encounter for general adult medical examination without abnormal findings: Secondary | ICD-10-CM

## 2022-05-21 DIAGNOSIS — Z119 Encounter for screening for infectious and parasitic diseases, unspecified: Secondary | ICD-10-CM | POA: Diagnosis not present

## 2022-05-21 DIAGNOSIS — Z87828 Personal history of other (healed) physical injury and trauma: Secondary | ICD-10-CM | POA: Diagnosis not present

## 2022-05-21 DIAGNOSIS — Z23 Encounter for immunization: Secondary | ICD-10-CM

## 2022-05-21 DIAGNOSIS — Z2821 Immunization not carried out because of patient refusal: Secondary | ICD-10-CM

## 2022-05-21 HISTORY — DX: Personal history of other (healed) physical injury and trauma: Z87.828

## 2022-05-21 LAB — CBC WITH DIFFERENTIAL/PLATELET
Basophils Absolute: 0.1 10*3/uL (ref 0.0–0.1)
Basophils Relative: 1.3 % (ref 0.0–3.0)
Eosinophils Absolute: 0 10*3/uL (ref 0.0–0.7)
Eosinophils Relative: 1 % (ref 0.0–5.0)
HCT: 42.4 % (ref 39.0–52.0)
Hemoglobin: 14.7 g/dL (ref 13.0–17.0)
Lymphocytes Relative: 30.9 % (ref 12.0–46.0)
Lymphs Abs: 1.4 10*3/uL (ref 0.7–4.0)
MCHC: 34.6 g/dL (ref 30.0–36.0)
MCV: 99.6 fl (ref 78.0–100.0)
Monocytes Absolute: 0.4 10*3/uL (ref 0.1–1.0)
Monocytes Relative: 8.1 % (ref 3.0–12.0)
Neutro Abs: 2.6 10*3/uL (ref 1.4–7.7)
Neutrophils Relative %: 58.7 % (ref 43.0–77.0)
Platelets: 336 10*3/uL (ref 150.0–400.0)
RBC: 4.26 Mil/uL (ref 4.22–5.81)
RDW: 12.4 % (ref 11.5–15.5)
WBC: 4.4 10*3/uL (ref 4.0–10.5)

## 2022-05-21 LAB — COMPREHENSIVE METABOLIC PANEL
ALT: 21 U/L (ref 0–53)
AST: 51 U/L — ABNORMAL HIGH (ref 0–37)
Albumin: 4.8 g/dL (ref 3.5–5.2)
Alkaline Phosphatase: 56 U/L (ref 39–117)
BUN: 7 mg/dL (ref 6–23)
CO2: 28 mEq/L (ref 19–32)
Calcium: 9.5 mg/dL (ref 8.4–10.5)
Chloride: 101 mEq/L (ref 96–112)
Creatinine, Ser: 0.71 mg/dL (ref 0.40–1.50)
GFR: 128.37 mL/min (ref >60.00)
Glucose, Bld: 120 mg/dL — ABNORMAL HIGH (ref 70–99)
Potassium: 3.8 mEq/L (ref 3.5–5.1)
Sodium: 141 mEq/L (ref 135–145)
Total Bilirubin: 0.6 mg/dL (ref 0.2–1.2)
Total Protein: 8.4 g/dL — ABNORMAL HIGH (ref 6.0–8.3)

## 2022-05-21 LAB — LIPID PANEL
Cholesterol: 298 mg/dL — ABNORMAL HIGH (ref 0–200)
HDL: 180.7 mg/dL (ref >39.00)
LDL Cholesterol: 111 mg/dL — ABNORMAL HIGH (ref 0–99)
NonHDL: 117.38
Total CHOL/HDL Ratio: 2
Triglycerides: 34 mg/dL (ref 0.0–149.0)
VLDL: 6.8 mg/dL (ref 0.0–40.0)

## 2022-05-21 NOTE — Assessment & Plan Note (Signed)
Offered meds Did brief intervention x 10 minutes He declines for now.

## 2022-05-21 NOTE — Assessment & Plan Note (Addendum)
adderall in past made him dehydrated and didn't help He doesn't like how stimulants make him feel
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
## 2022-05-22 LAB — HIV ANTIBODY (ROUTINE TESTING W REFLEX): HIV 1&2 Ab, 4th Generation: NONREACTIVE

## 2022-05-22 LAB — HEPATITIS C ANTIBODY: Hepatitis C Ab: NONREACTIVE

## 2022-06-04 ENCOUNTER — Emergency Department (HOSPITAL_COMMUNITY)
Admission: EM | Admit: 2022-06-04 | Discharge: 2022-06-04 | Discharge status: Against Medical Advice | Payer: BC Managed Care – PPO | Attending: Student | Admitting: Student

## 2022-06-04 ENCOUNTER — Encounter (HOSPITAL_COMMUNITY): Payer: Self-pay

## 2022-06-04 ENCOUNTER — Emergency Department (HOSPITAL_COMMUNITY): Payer: BC Managed Care – PPO

## 2022-06-04 ENCOUNTER — Other Ambulatory Visit: Payer: Self-pay

## 2022-06-04 DIAGNOSIS — Z5321 Procedure and treatment not carried out due to patient leaving prior to being seen by health care provider: Secondary | ICD-10-CM | POA: Diagnosis not present

## 2022-06-04 DIAGNOSIS — R519 Headache, unspecified: Secondary | ICD-10-CM | POA: Insufficient documentation

## 2022-06-04 NOTE — ED Triage Notes (Addendum)
Patient states he is concerned about his brain. He states he has been working out a lot and feels like the left side of his brain is numb. Patient says he heard a "pop" in his brain on the left side after working out. He states he would like an MRI. Patient reports a headache with no pain, no vision changes, or dizziness. Patient also states he has been drinking, with last drink about an hour ago.

## 2022-06-04 NOTE — ED Provider Triage Note (Signed)
Emergency Medicine Provider Triage Evaluation Note  Kyle Webb , a 26 y.o. male  was evaluated in triage.  Pt reports that yesterday around 6 PM he was working out and felt a pop on the left side of his head.  He presents to the emergency department now reporting that he is concerned he has had a brain bleed and would like an MRI of his brain.  He initially reports a headache with no pain he reports feeling that the left side of his brain is no longer, he does not have facial numbness or numbness over his scalp but reports that "the connectivity is lost".  He denies any visual changes, numbness or weakness.  Patient also reports drinking alcohol about an hour prior to arrival today.  Review of Systems  Positive: Headache Negative: Vision changes, numbness, weakness  Physical Exam  BP 134/89 (BP Location: Left Arm)   Pulse 91   Temp 98.2 F (36.8 C) (Oral)   Resp 18   SpO2 97%  Gen:   Awake, no distress   Resp:  Normal effort  MSK:   Moves extremities without difficulty  Other:  No focal neurologic deficits  Medical Decision Making  Medically screening exam initiated at 8:00 PM.  Appropriate orders placed.  Kyle Webb was informed that the remainder of the evaluation will be completed by another provider, this initial triage assessment does not replace that evaluation, and the importance of remaining in the ED until their evaluation is complete.  Patient with no focal neurologic deficits, explained to patient that MRI at this time is not the appropriate test for his concerns, CT head ordered.   Kyle Webb, [place] 06/04/22 2008

## 2022-06-04 NOTE — ED Notes (Signed)
Pt refused to stay before being seen. RN explained to pt the doctor will be made aware and pt did not want to stay.

## 2022-12-01 DIAGNOSIS — F1020 Alcohol dependence, uncomplicated: Secondary | ICD-10-CM | POA: Diagnosis not present

## 2022-12-01 DIAGNOSIS — F419 Anxiety disorder, unspecified: Secondary | ICD-10-CM | POA: Diagnosis not present

## 2022-12-02 DIAGNOSIS — Z6834 Body mass index (BMI) 34.0-34.9, adult: Secondary | ICD-10-CM | POA: Diagnosis not present

## 2022-12-02 DIAGNOSIS — F419 Anxiety disorder, unspecified: Secondary | ICD-10-CM | POA: Diagnosis not present

## 2022-12-02 DIAGNOSIS — F1020 Alcohol dependence, uncomplicated: Secondary | ICD-10-CM | POA: Diagnosis not present

## 2022-12-03 DIAGNOSIS — F1020 Alcohol dependence, uncomplicated: Secondary | ICD-10-CM | POA: Diagnosis not present

## 2022-12-03 DIAGNOSIS — F419 Anxiety disorder, unspecified: Secondary | ICD-10-CM | POA: Diagnosis not present

## 2022-12-04 DIAGNOSIS — F419 Anxiety disorder, unspecified: Secondary | ICD-10-CM | POA: Diagnosis not present

## 2022-12-04 DIAGNOSIS — F1020 Alcohol dependence, uncomplicated: Secondary | ICD-10-CM | POA: Diagnosis not present

## 2022-12-05 DIAGNOSIS — F1020 Alcohol dependence, uncomplicated: Secondary | ICD-10-CM | POA: Diagnosis not present

## 2022-12-05 DIAGNOSIS — F419 Anxiety disorder, unspecified: Secondary | ICD-10-CM | POA: Diagnosis not present

## 2022-12-06 DIAGNOSIS — F1020 Alcohol dependence, uncomplicated: Secondary | ICD-10-CM | POA: Diagnosis not present

## 2022-12-06 DIAGNOSIS — F419 Anxiety disorder, unspecified: Secondary | ICD-10-CM | POA: Diagnosis not present

## 2022-12-07 DIAGNOSIS — F1020 Alcohol dependence, uncomplicated: Secondary | ICD-10-CM | POA: Diagnosis not present

## 2022-12-07 DIAGNOSIS — F419 Anxiety disorder, unspecified: Secondary | ICD-10-CM | POA: Diagnosis not present

## 2022-12-08 DIAGNOSIS — F419 Anxiety disorder, unspecified: Secondary | ICD-10-CM | POA: Diagnosis not present

## 2022-12-08 DIAGNOSIS — F1020 Alcohol dependence, uncomplicated: Secondary | ICD-10-CM | POA: Diagnosis not present

## 2022-12-09 DIAGNOSIS — F1020 Alcohol dependence, uncomplicated: Secondary | ICD-10-CM | POA: Diagnosis not present

## 2022-12-09 DIAGNOSIS — F419 Anxiety disorder, unspecified: Secondary | ICD-10-CM | POA: Diagnosis not present

## 2022-12-10 DIAGNOSIS — F419 Anxiety disorder, unspecified: Secondary | ICD-10-CM | POA: Diagnosis not present

## 2022-12-10 DIAGNOSIS — F1020 Alcohol dependence, uncomplicated: Secondary | ICD-10-CM | POA: Diagnosis not present

## 2022-12-11 DIAGNOSIS — F1020 Alcohol dependence, uncomplicated: Secondary | ICD-10-CM | POA: Diagnosis not present

## 2022-12-11 DIAGNOSIS — F419 Anxiety disorder, unspecified: Secondary | ICD-10-CM | POA: Diagnosis not present

## 2022-12-12 DIAGNOSIS — F419 Anxiety disorder, unspecified: Secondary | ICD-10-CM | POA: Diagnosis not present

## 2022-12-12 DIAGNOSIS — F1020 Alcohol dependence, uncomplicated: Secondary | ICD-10-CM | POA: Diagnosis not present

## 2022-12-13 DIAGNOSIS — F1020 Alcohol dependence, uncomplicated: Secondary | ICD-10-CM | POA: Diagnosis not present

## 2022-12-13 DIAGNOSIS — F419 Anxiety disorder, unspecified: Secondary | ICD-10-CM | POA: Diagnosis not present

## 2022-12-14 DIAGNOSIS — F419 Anxiety disorder, unspecified: Secondary | ICD-10-CM | POA: Diagnosis not present

## 2022-12-14 DIAGNOSIS — F1020 Alcohol dependence, uncomplicated: Secondary | ICD-10-CM | POA: Diagnosis not present

## 2022-12-15 DIAGNOSIS — F1020 Alcohol dependence, uncomplicated: Secondary | ICD-10-CM | POA: Diagnosis not present

## 2022-12-15 DIAGNOSIS — F419 Anxiety disorder, unspecified: Secondary | ICD-10-CM | POA: Diagnosis not present

## 2022-12-16 DIAGNOSIS — F1020 Alcohol dependence, uncomplicated: Secondary | ICD-10-CM | POA: Diagnosis not present

## 2022-12-16 DIAGNOSIS — F419 Anxiety disorder, unspecified: Secondary | ICD-10-CM | POA: Diagnosis not present

## 2022-12-17 DIAGNOSIS — F419 Anxiety disorder, unspecified: Secondary | ICD-10-CM | POA: Diagnosis not present

## 2022-12-17 DIAGNOSIS — F1020 Alcohol dependence, uncomplicated: Secondary | ICD-10-CM | POA: Diagnosis not present

## 2022-12-18 DIAGNOSIS — F1020 Alcohol dependence, uncomplicated: Secondary | ICD-10-CM | POA: Diagnosis not present

## 2022-12-18 DIAGNOSIS — F419 Anxiety disorder, unspecified: Secondary | ICD-10-CM | POA: Diagnosis not present

## 2022-12-19 DIAGNOSIS — F419 Anxiety disorder, unspecified: Secondary | ICD-10-CM | POA: Diagnosis not present

## 2022-12-19 DIAGNOSIS — F1020 Alcohol dependence, uncomplicated: Secondary | ICD-10-CM | POA: Diagnosis not present

## 2022-12-20 DIAGNOSIS — F1020 Alcohol dependence, uncomplicated: Secondary | ICD-10-CM | POA: Diagnosis not present

## 2022-12-20 DIAGNOSIS — F419 Anxiety disorder, unspecified: Secondary | ICD-10-CM | POA: Diagnosis not present

## 2022-12-21 DIAGNOSIS — F1020 Alcohol dependence, uncomplicated: Secondary | ICD-10-CM | POA: Diagnosis not present

## 2022-12-21 DIAGNOSIS — F419 Anxiety disorder, unspecified: Secondary | ICD-10-CM | POA: Diagnosis not present

## 2022-12-22 DIAGNOSIS — F419 Anxiety disorder, unspecified: Secondary | ICD-10-CM | POA: Diagnosis not present

## 2022-12-22 DIAGNOSIS — F1020 Alcohol dependence, uncomplicated: Secondary | ICD-10-CM | POA: Diagnosis not present

## 2023-01-05 DIAGNOSIS — F419 Anxiety disorder, unspecified: Secondary | ICD-10-CM | POA: Diagnosis not present

## 2023-01-05 DIAGNOSIS — F1020 Alcohol dependence, uncomplicated: Secondary | ICD-10-CM | POA: Diagnosis not present

## 2023-01-05 DIAGNOSIS — F1220 Cannabis dependence, uncomplicated: Secondary | ICD-10-CM | POA: Diagnosis not present

## 2023-01-10 DIAGNOSIS — F419 Anxiety disorder, unspecified: Secondary | ICD-10-CM | POA: Diagnosis not present

## 2023-01-10 DIAGNOSIS — F1220 Cannabis dependence, uncomplicated: Secondary | ICD-10-CM | POA: Diagnosis not present

## 2023-01-10 DIAGNOSIS — F1020 Alcohol dependence, uncomplicated: Secondary | ICD-10-CM | POA: Diagnosis not present

## 2023-01-13 IMAGING — CT CT ABD-PELV W/ CM
2 of 4 series · 16 of 46 positions shown, 18 images · IV contrast (omnipaque)
Comparison: None.

CLINICAL DATA: Right lower quadrant abdominal pain

EXAM:
CT ABDOMEN AND PELVIS WITH CONTRAST
TECHNIQUE: Multidetector CT imaging of the abdomen and pelvis was performed
using the standard protocol following bolus administration of
intravenous contrast.
CONTRAST:  100mL OMNIPAQUE IOHEXOL 300 MG/ML  SOLN

[Series 2: axial st · axial · 0.81mm/px · z∈[-544,-104]mm · 13 of 100 slices shown, 15 images]
[im 6/100  soft-tissue]
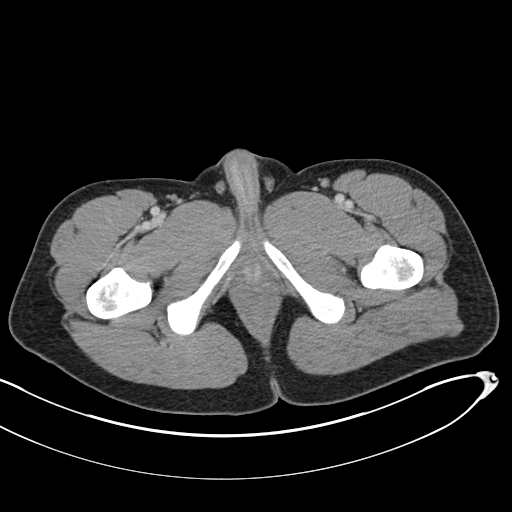
[im 6/100  bone]
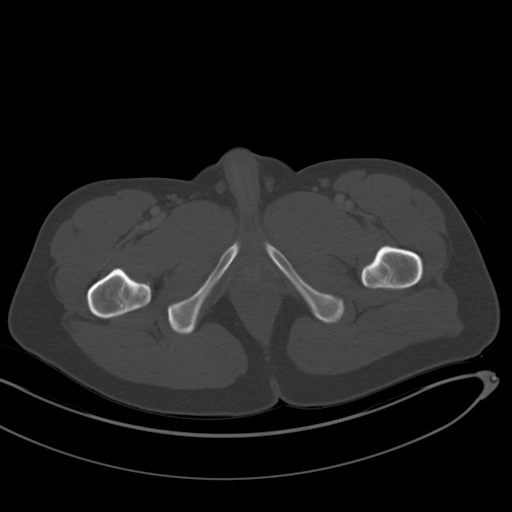
[im 12/100  soft-tissue]
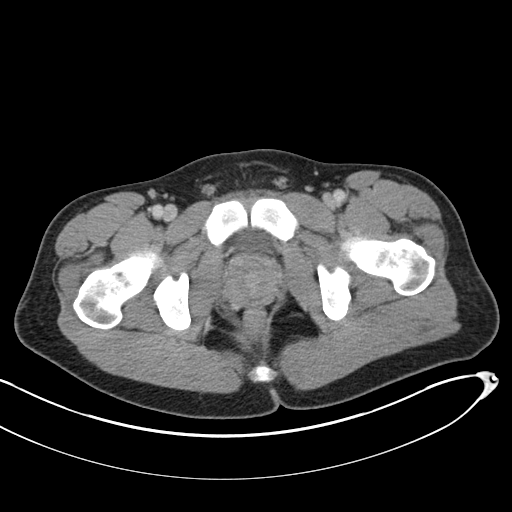
[im 24/100  soft-tissue]
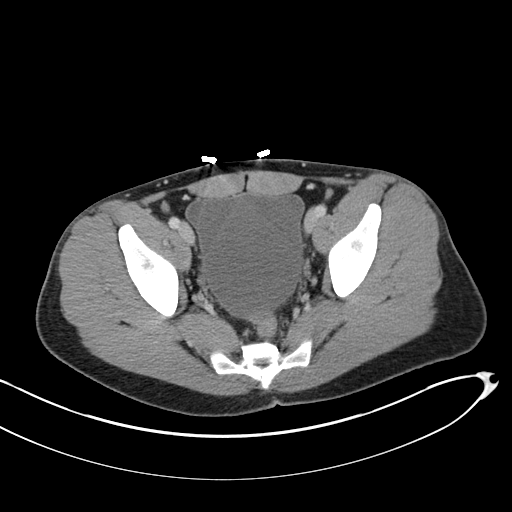
[im 30/100  soft-tissue]
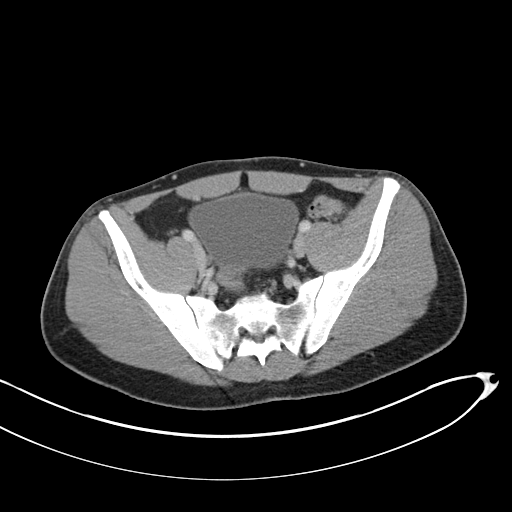
[im 35/100  soft-tissue]
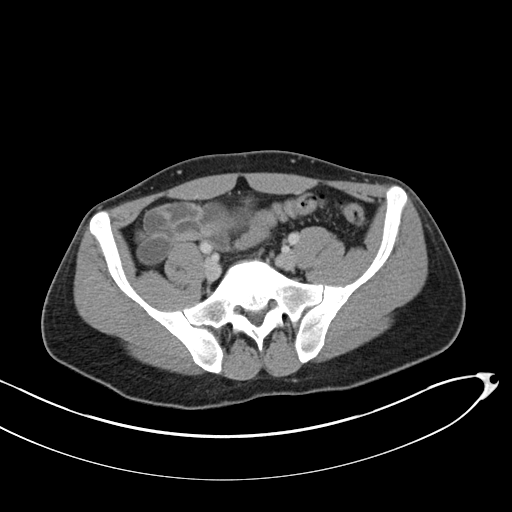
[im 41/100  soft-tissue]
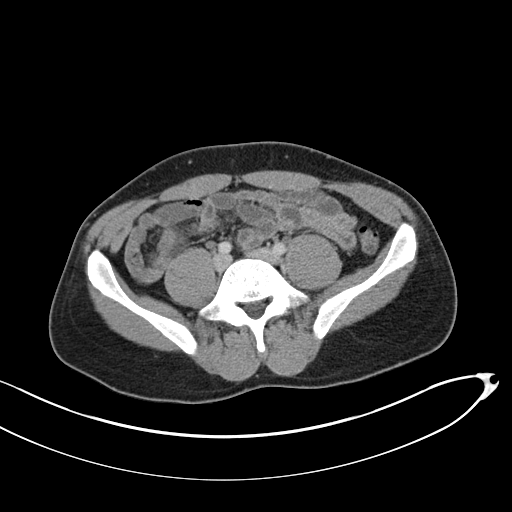
[im 53/100  soft-tissue]
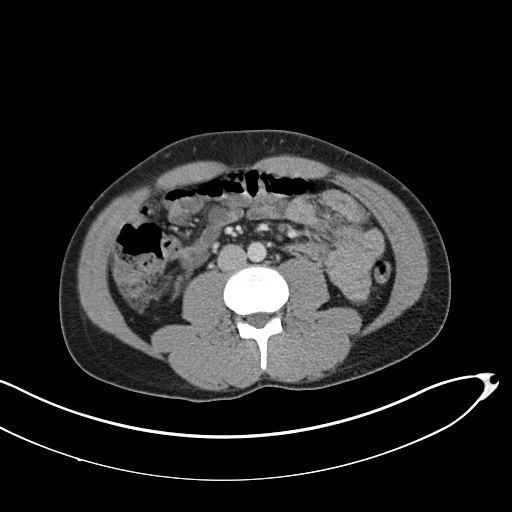
[im 59/100  soft-tissue]
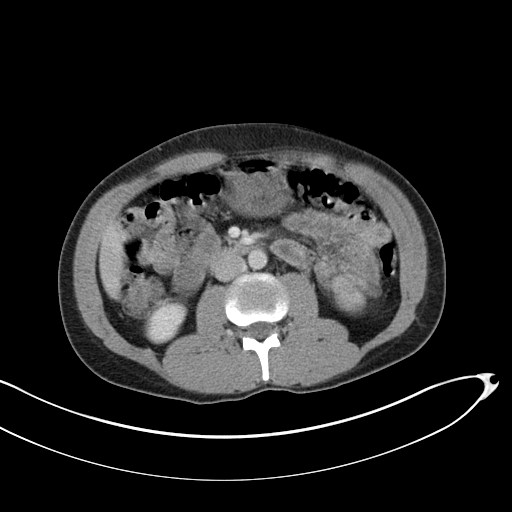
[im 65/100  soft-tissue]
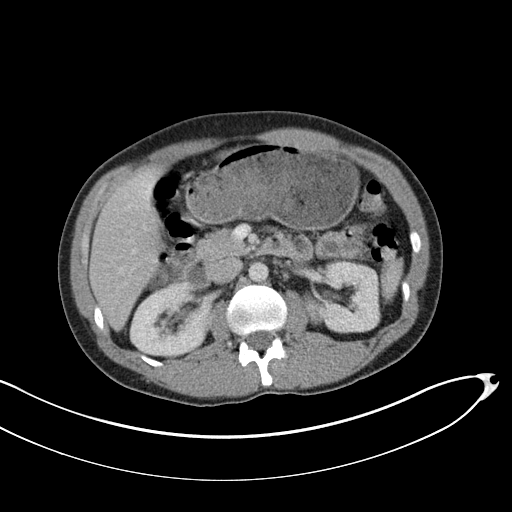
[im 65/100  bone]
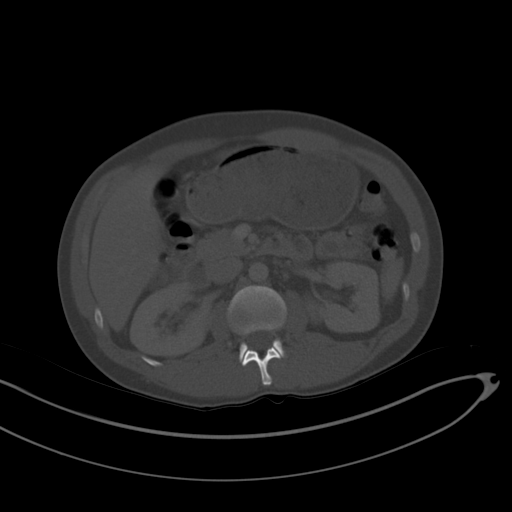
[im 70/100  soft-tissue]
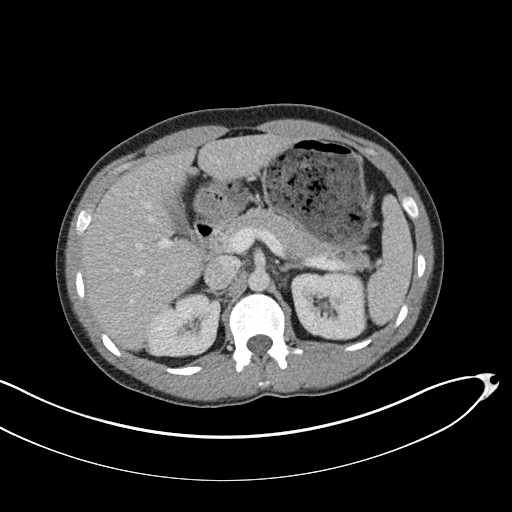
[im 76/100  soft-tissue]
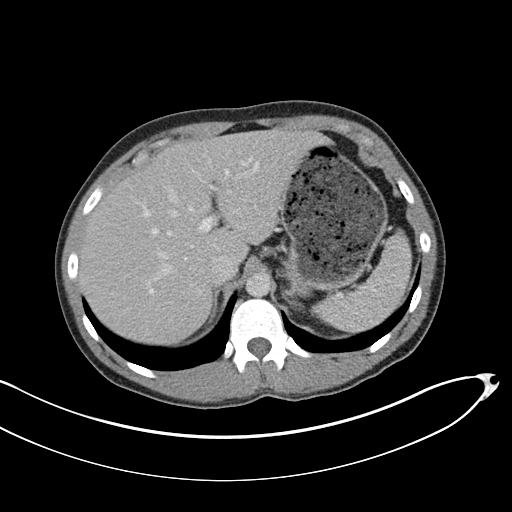
[im 88/100  soft-tissue]
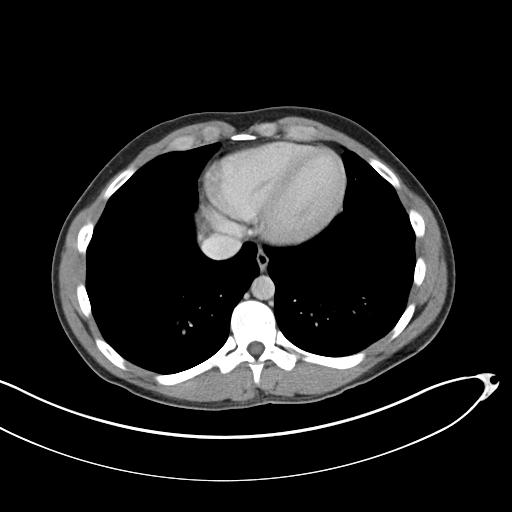
[im 94/100  soft-tissue]
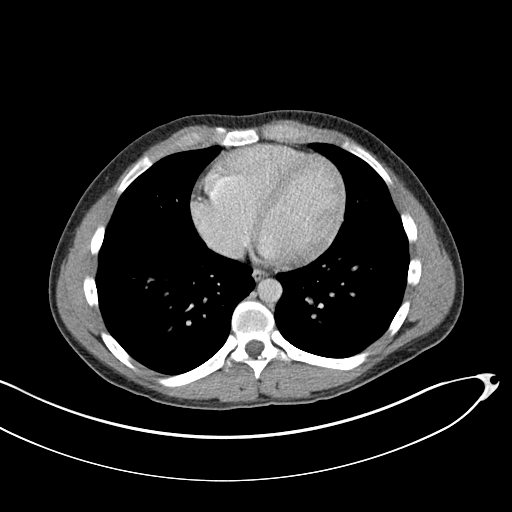

[Series 6: coronal st · coronal · 0.77mm/px · 3 of 130 slices shown]
[im 44/130  soft-tissue]
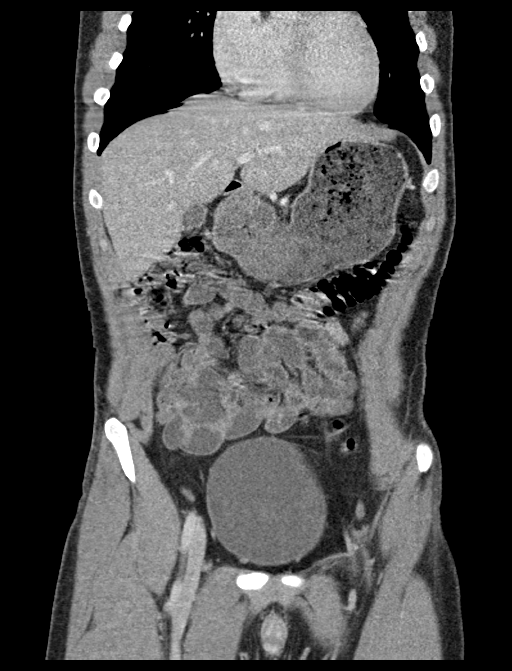
[im 58/130  soft-tissue]
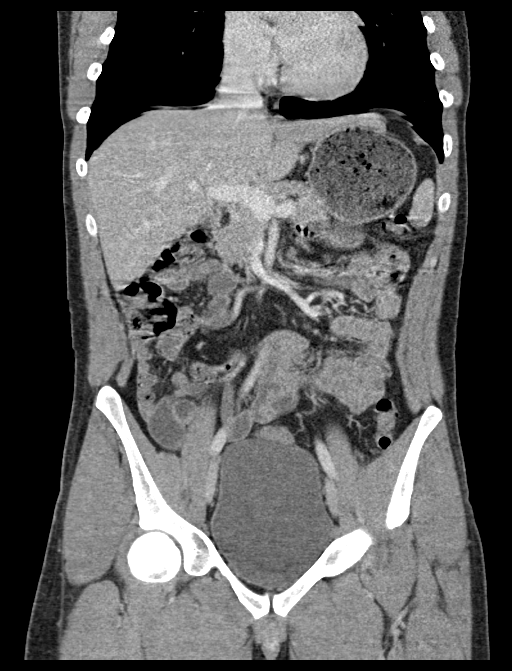
[im 72/130  soft-tissue]
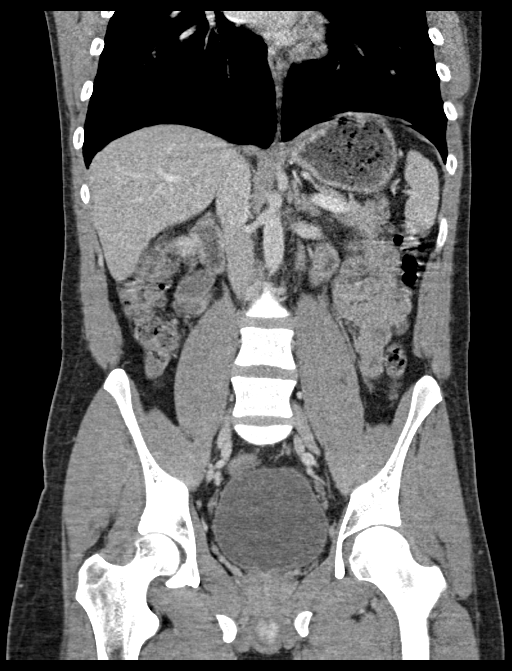

[16 of 46 positions shown; findings below may reference images not displayed]

FINDINGS: Lower chest: Lung bases are clear. No effusions. Heart is normal
size.

Hepatobiliary: No focal hepatic abnormality. Gallbladder
unremarkable.

Pancreas: No focal abnormality or ductal dilatation.

Spleen: No focal abnormality.  Normal size.

Adrenals/Urinary Tract: No adrenal abnormality. No focal renal
abnormality. No stones or hydronephrosis. Urinary bladder is
unremarkable.

Stomach/Bowel: Normal appendix. Stomach, large and small bowel
grossly unremarkable.

Vascular/Lymphatic: No evidence of aneurysm or adenopathy.

Reproductive: No visible focal abnormality.

Other: No free fluid or free air.

Musculoskeletal: No acute bony abnormality.
IMPRESSION: Normal appendix.

No acute findings in the abdomen or pelvis.

## 2023-01-19 DIAGNOSIS — F1220 Cannabis dependence, uncomplicated: Secondary | ICD-10-CM | POA: Diagnosis not present

## 2023-01-19 DIAGNOSIS — F419 Anxiety disorder, unspecified: Secondary | ICD-10-CM | POA: Diagnosis not present

## 2023-01-19 DIAGNOSIS — F1020 Alcohol dependence, uncomplicated: Secondary | ICD-10-CM | POA: Diagnosis not present

## 2023-01-20 DIAGNOSIS — F1220 Cannabis dependence, uncomplicated: Secondary | ICD-10-CM | POA: Diagnosis not present

## 2023-01-20 DIAGNOSIS — F1020 Alcohol dependence, uncomplicated: Secondary | ICD-10-CM | POA: Diagnosis not present

## 2023-01-20 DIAGNOSIS — F419 Anxiety disorder, unspecified: Secondary | ICD-10-CM | POA: Diagnosis not present

## 2023-01-21 DIAGNOSIS — F1220 Cannabis dependence, uncomplicated: Secondary | ICD-10-CM | POA: Diagnosis not present

## 2023-01-21 DIAGNOSIS — F419 Anxiety disorder, unspecified: Secondary | ICD-10-CM | POA: Diagnosis not present

## 2023-01-21 DIAGNOSIS — F1020 Alcohol dependence, uncomplicated: Secondary | ICD-10-CM | POA: Diagnosis not present

## 2023-01-22 DIAGNOSIS — F419 Anxiety disorder, unspecified: Secondary | ICD-10-CM | POA: Diagnosis not present

## 2023-01-22 DIAGNOSIS — F1220 Cannabis dependence, uncomplicated: Secondary | ICD-10-CM | POA: Diagnosis not present

## 2023-01-22 DIAGNOSIS — F1020 Alcohol dependence, uncomplicated: Secondary | ICD-10-CM | POA: Diagnosis not present

## 2023-01-23 DIAGNOSIS — F419 Anxiety disorder, unspecified: Secondary | ICD-10-CM | POA: Diagnosis not present

## 2023-01-23 DIAGNOSIS — F1020 Alcohol dependence, uncomplicated: Secondary | ICD-10-CM | POA: Diagnosis not present

## 2023-01-23 DIAGNOSIS — F1220 Cannabis dependence, uncomplicated: Secondary | ICD-10-CM | POA: Diagnosis not present

## 2023-01-24 DIAGNOSIS — F1220 Cannabis dependence, uncomplicated: Secondary | ICD-10-CM | POA: Diagnosis not present

## 2023-01-24 DIAGNOSIS — F1020 Alcohol dependence, uncomplicated: Secondary | ICD-10-CM | POA: Diagnosis not present

## 2023-01-24 DIAGNOSIS — F419 Anxiety disorder, unspecified: Secondary | ICD-10-CM | POA: Diagnosis not present

## 2023-01-25 DIAGNOSIS — F1020 Alcohol dependence, uncomplicated: Secondary | ICD-10-CM | POA: Diagnosis not present

## 2023-01-25 DIAGNOSIS — F419 Anxiety disorder, unspecified: Secondary | ICD-10-CM | POA: Diagnosis not present

## 2023-01-25 DIAGNOSIS — F1220 Cannabis dependence, uncomplicated: Secondary | ICD-10-CM | POA: Diagnosis not present

## 2023-01-26 DIAGNOSIS — F419 Anxiety disorder, unspecified: Secondary | ICD-10-CM | POA: Diagnosis not present

## 2023-01-26 DIAGNOSIS — F1020 Alcohol dependence, uncomplicated: Secondary | ICD-10-CM | POA: Diagnosis not present

## 2023-01-26 DIAGNOSIS — F1220 Cannabis dependence, uncomplicated: Secondary | ICD-10-CM | POA: Diagnosis not present

## 2023-01-27 DIAGNOSIS — F1220 Cannabis dependence, uncomplicated: Secondary | ICD-10-CM | POA: Diagnosis not present

## 2023-01-27 DIAGNOSIS — F419 Anxiety disorder, unspecified: Secondary | ICD-10-CM | POA: Diagnosis not present

## 2023-01-27 DIAGNOSIS — F1020 Alcohol dependence, uncomplicated: Secondary | ICD-10-CM | POA: Diagnosis not present

## 2023-01-28 DIAGNOSIS — F1220 Cannabis dependence, uncomplicated: Secondary | ICD-10-CM | POA: Diagnosis not present

## 2023-01-28 DIAGNOSIS — F1020 Alcohol dependence, uncomplicated: Secondary | ICD-10-CM | POA: Diagnosis not present

## 2023-01-28 DIAGNOSIS — F419 Anxiety disorder, unspecified: Secondary | ICD-10-CM | POA: Diagnosis not present

## 2023-01-29 DIAGNOSIS — F1220 Cannabis dependence, uncomplicated: Secondary | ICD-10-CM | POA: Diagnosis not present

## 2023-01-29 DIAGNOSIS — F1020 Alcohol dependence, uncomplicated: Secondary | ICD-10-CM | POA: Diagnosis not present

## 2023-01-29 DIAGNOSIS — F419 Anxiety disorder, unspecified: Secondary | ICD-10-CM | POA: Diagnosis not present

## 2023-01-30 DIAGNOSIS — F1220 Cannabis dependence, uncomplicated: Secondary | ICD-10-CM | POA: Diagnosis not present

## 2023-01-30 DIAGNOSIS — F419 Anxiety disorder, unspecified: Secondary | ICD-10-CM | POA: Diagnosis not present

## 2023-01-30 DIAGNOSIS — F1020 Alcohol dependence, uncomplicated: Secondary | ICD-10-CM | POA: Diagnosis not present

## 2023-01-31 DIAGNOSIS — F1220 Cannabis dependence, uncomplicated: Secondary | ICD-10-CM | POA: Diagnosis not present

## 2023-01-31 DIAGNOSIS — F1020 Alcohol dependence, uncomplicated: Secondary | ICD-10-CM | POA: Diagnosis not present

## 2023-01-31 DIAGNOSIS — F419 Anxiety disorder, unspecified: Secondary | ICD-10-CM | POA: Diagnosis not present

## 2023-02-01 DIAGNOSIS — F419 Anxiety disorder, unspecified: Secondary | ICD-10-CM | POA: Diagnosis not present

## 2023-02-01 DIAGNOSIS — F1020 Alcohol dependence, uncomplicated: Secondary | ICD-10-CM | POA: Diagnosis not present

## 2023-02-01 DIAGNOSIS — F1220 Cannabis dependence, uncomplicated: Secondary | ICD-10-CM | POA: Diagnosis not present

## 2023-02-02 DIAGNOSIS — F419 Anxiety disorder, unspecified: Secondary | ICD-10-CM | POA: Diagnosis not present

## 2023-02-02 DIAGNOSIS — F1020 Alcohol dependence, uncomplicated: Secondary | ICD-10-CM | POA: Diagnosis not present

## 2023-02-02 DIAGNOSIS — F1220 Cannabis dependence, uncomplicated: Secondary | ICD-10-CM | POA: Diagnosis not present

## 2023-02-03 DIAGNOSIS — F419 Anxiety disorder, unspecified: Secondary | ICD-10-CM | POA: Diagnosis not present

## 2023-02-03 DIAGNOSIS — F1020 Alcohol dependence, uncomplicated: Secondary | ICD-10-CM | POA: Diagnosis not present

## 2023-02-03 DIAGNOSIS — F1220 Cannabis dependence, uncomplicated: Secondary | ICD-10-CM | POA: Diagnosis not present

## 2023-12-26 ENCOUNTER — Emergency Department (HOSPITAL_COMMUNITY)

## 2023-12-26 ENCOUNTER — Emergency Department (HOSPITAL_COMMUNITY)
Admission: EM | Admit: 2023-12-26 | Discharge: 2023-12-26 | Discharge status: Against Medical Advice | Attending: Emergency Medicine | Admitting: Emergency Medicine

## 2023-12-26 ENCOUNTER — Other Ambulatory Visit: Payer: Self-pay

## 2023-12-26 DIAGNOSIS — Z5329 Procedure and treatment not carried out because of patient's decision for other reasons: Secondary | ICD-10-CM | POA: Diagnosis not present

## 2023-12-26 DIAGNOSIS — I10 Essential (primary) hypertension: Secondary | ICD-10-CM | POA: Diagnosis not present

## 2023-12-26 DIAGNOSIS — W6191XA Bitten by other birds, initial encounter: Secondary | ICD-10-CM | POA: Diagnosis not present

## 2023-12-26 DIAGNOSIS — S61051A Open bite of right thumb without damage to nail, initial encounter: Secondary | ICD-10-CM | POA: Diagnosis not present

## 2023-12-26 DIAGNOSIS — S61451A Open bite of right hand, initial encounter: Secondary | ICD-10-CM | POA: Diagnosis not present

## 2023-12-26 DIAGNOSIS — F10929 Alcohol use, unspecified with intoxication, unspecified: Secondary | ICD-10-CM | POA: Diagnosis not present

## 2023-12-26 MED ORDER — AMOXICILLIN-POT CLAVULANATE 875-125 MG PO TABS: 1.0000 | ORAL_TABLET | Freq: Two times a day (BID) | ORAL | 0 refills | Status: DC

## 2023-12-26 MED ORDER — IBUPROFEN 800 MG PO TABS
800.0000 mg | ORAL_TABLET | Freq: Once | ORAL | Status: AC
  Administered 2023-12-26: 800 mg via ORAL
  Filled 2023-12-26: qty 1

## 2023-12-26 MED ORDER — AMOXICILLIN-POT CLAVULANATE 875-125 MG PO TABS
1.0000 | ORAL_TABLET | Freq: Once | ORAL | Status: AC
  Administered 2023-12-26: 1 via ORAL
  Filled 2023-12-26: qty 1

## 2023-12-26 NOTE — ED Triage Notes (Signed)
 Pt presents to the ED from home with complaints of right thumb bleeding and pain following a bite from his pet bird. Pt also admits to drinking "a couple of 4 lokos" today.

## 2023-12-26 NOTE — ED Notes (Signed)
 Dr. Daivd Dub entered pt's room and pt was no where to be found. Security officer at front entrance saw the pt get into his vehicle. As we approached to attempt to ask pt to come back inside he drove away.

## 2023-12-26 NOTE — ED Provider Notes (Signed)
 [place] EMERGENCY DEPARTMENT AT [facility] Provider Note   CSN: [id number] Arrival date & time: 12/26/23  1819     History  Chief Complaint  Patient presents with   Animal Bite    Kyle Webb is a 28 y.o. male.  HPI Patient reports that he was bitten on the right thumb by a red tail Hawk.  He was vague in describing how this happened.  He implied that he had managed to somewhat domesticated it.  The way he had done this by his explanation was "he had gotten to know its babies".  But he admitted that he really "should not have done it" he had made a bad mistake.  Reports the bird bit his right thumb very aggressively.  However, he does not think it is broken and he reports he can move his thumb in all directions.  He did not wash it but came straight to the emergency department for evaluation.  Triage patient had admitted to drinking some four Loco's today.    Home Medications Prior to Admission medications   Medication Sig Start Date End Date Taking? Authorizing Provider  amoxicillin -clavulanate (AUGMENTIN ) 875-125 MG tablet Take 1 tablet by mouth every 12 (twelve) hours. 12/26/23  Yes Wynetta Heckle, MD      Allergies    Patient has no known allergies.    Review of Systems   Review of Systems  Physical Exam Updated Vital Signs BP 138/87   Pulse 89   Temp 98 F (36.7 C) (Oral)   Resp 16   SpO2 100%  Physical Exam Constitutional:      Comments: Patient is sitting at the edge of stretcher.  Well-nourished well-developed.  Clinically well in appearance.  Eyes:     Extraocular Movements: Extraocular movements intact.  Pulmonary:     Effort: Pulmonary effort is normal.  Musculoskeletal:     Comments: Patient has several puncture wounds on the right thumb.  These appear to be mostly deeper partial abrasions.  No deformity of the thumb.  Patient exhibits full range of motion with flexion extension and abduction adduction of the thumb.  Skin:    General:  Skin is warm and dry.  Neurological:     Comments: Patient is awake and alert but somewhat vague and evasive.  No focal neurologic deficits.     ED Results / Procedures / Treatments   Labs (all labs ordered are listed, but only abnormal results are displayed) Labs Reviewed - No data to display  EKG None  Radiology No results found.  Procedures Procedures    Medications Ordered in ED Medications  amoxicillin -clavulanate (AUGMENTIN ) 875-125 MG per tablet 1 tablet (1 tablet Oral Given 12/26/23 1921)  ibuprofen  (ADVIL ) tablet 800 mg (800 mg Oral Given 12/26/23 1921)    ED Course/ Medical Decision Making/ A&P                                 Medical Decision Making Amount and/or Complexity of Data Reviewed Radiology: ordered.  Risk Prescription drug management.  He reports he really came just to get his finger cleaned and looked at and does not think is broken.  He reports amoxicillin  is an effective antibiotic for him and he is taken in the past without adverse reaction.  I did feel the patient should have x-ray given puncture wounds of the thumb and potential for any possible foreign body.  Order for  nursing to clean with wound cleanse and soak the wound.  20: 13 patient is not in his room.  He refused x-ray and there is a note that patient went to the TCU asking for a Band-Aid and he was redirected back to his room.  At this time he is not in the room and peers to of the left.  We will look for him.  Patient did take his dose of Augmentin .   Nursing reports they were able to clean the wounds but patient has eloped.  Security had seen the patient in his car but when approached to request he come back to complete treatment, patient left.  Intake staff reports that the patient has specifically requested that his parents not be contacted.  Patient did not leave a cell phone number.  Patient's AVS [facility] pharmacy of choice.  At this time I will  prescribe Augmentin  to this pharmacy and hopefully they will be able to contact the patient with an on file contact.  Time the patient has eloped and not completed treatment.        Final Clinical Impression(s) / ED Diagnoses Final diagnoses:  Bitten by other birds, initial encounter    Rx / DC Orders ED Discharge Orders          Ordered    amoxicillin -clavulanate (AUGMENTIN ) 875-125 MG tablet  Every 12 hours        12/26/23 2029              Wynetta Heckle, MD 12/26/23 2032

## 2023-12-26 NOTE — ED Notes (Signed)
 Patient was found wandering in [place]. TCU nurse returned the patient to his room and informed this Clinical research associate that the patient had wandered to TCU "looking for a band-aid." TCU nurse provided patient with a band-aid and redirected him to his bed.

## 2024-01-05 ENCOUNTER — Encounter (HOSPITAL_COMMUNITY): Payer: Self-pay

## 2024-01-05 ENCOUNTER — Emergency Department (HOSPITAL_COMMUNITY)
Admission: EM | Admit: 2024-01-05 | Discharge: 2024-01-06 | Disposition: A | Discharge status: Other Acute Inpatient Hospital | Source: Home / Self Care | Attending: Emergency Medicine | Admitting: Emergency Medicine

## 2024-01-05 ENCOUNTER — Other Ambulatory Visit: Payer: Self-pay

## 2024-01-05 DIAGNOSIS — F909 Attention-deficit hyperactivity disorder, unspecified type: Secondary | ICD-10-CM | POA: Diagnosis not present

## 2024-01-05 DIAGNOSIS — F10129 Alcohol abuse with intoxication, unspecified: Secondary | ICD-10-CM | POA: Diagnosis not present

## 2024-01-05 DIAGNOSIS — F1290 Cannabis use, unspecified, uncomplicated: Secondary | ICD-10-CM

## 2024-01-05 DIAGNOSIS — Z8249 Family history of ischemic heart disease and other diseases of the circulatory system: Secondary | ICD-10-CM | POA: Diagnosis not present

## 2024-01-05 DIAGNOSIS — Z79899 Other long term (current) drug therapy: Secondary | ICD-10-CM | POA: Diagnosis not present

## 2024-01-05 DIAGNOSIS — F10920 Alcohol use, unspecified with intoxication, uncomplicated: Secondary | ICD-10-CM

## 2024-01-05 DIAGNOSIS — I10 Essential (primary) hypertension: Secondary | ICD-10-CM | POA: Diagnosis not present

## 2024-01-05 DIAGNOSIS — R45851 Suicidal ideations: Secondary | ICD-10-CM | POA: Diagnosis not present

## 2024-01-05 DIAGNOSIS — F419 Anxiety disorder, unspecified: Secondary | ICD-10-CM | POA: Diagnosis not present

## 2024-01-05 DIAGNOSIS — F1020 Alcohol dependence, uncomplicated: Secondary | ICD-10-CM | POA: Diagnosis not present

## 2024-01-05 DIAGNOSIS — F329 Major depressive disorder, single episode, unspecified: Secondary | ICD-10-CM | POA: Insufficient documentation

## 2024-01-05 DIAGNOSIS — F332 Major depressive disorder, recurrent severe without psychotic features: Secondary | ICD-10-CM | POA: Diagnosis not present

## 2024-01-05 DIAGNOSIS — F17290 Nicotine dependence, other tobacco product, uncomplicated: Secondary | ICD-10-CM | POA: Diagnosis not present

## 2024-01-05 LAB — COMPREHENSIVE METABOLIC PANEL WITH GFR
ALT: 16 U/L (ref 0–44)
AST: 31 U/L (ref 15–41)
Albumin: 4.6 g/dL (ref 3.5–5.0)
Alkaline Phosphatase: 47 U/L (ref 38–126)
Anion gap: 14 (ref 5–15)
BUN: 7 mg/dL (ref 6–20)
CO2: 23 mmol/L (ref 22–32)
Calcium: 8.4 mg/dL — ABNORMAL LOW (ref 8.9–10.3)
Chloride: 104 mmol/L (ref 98–111)
Creatinine, Ser: 0.46 mg/dL — ABNORMAL LOW (ref 0.61–1.24)
GFR, Estimated: >60 mL/min (ref >60)
Glucose, Bld: 96 mg/dL (ref 70–99)
Potassium: 3.5 mmol/L (ref 3.5–5.1)
Sodium: 141 mmol/L (ref 135–145)
Total Bilirubin: 0.4 mg/dL (ref 0.0–1.2)
Total Protein: 8.2 g/dL — ABNORMAL HIGH (ref 6.5–8.1)

## 2024-01-05 LAB — CBC WITH DIFFERENTIAL/PLATELET
Abs Immature Granulocytes: 0.02 10*3/uL (ref 0.00–0.07)
Basophils Absolute: 0.1 10*3/uL (ref 0.0–0.1)
Basophils Relative: 1 %
Eosinophils Absolute: 0 10*3/uL (ref 0.0–0.5)
Eosinophils Relative: 1 %
HCT: 45.2 % (ref 39.0–52.0)
Hemoglobin: 15.6 g/dL (ref 13.0–17.0)
Immature Granulocytes: 0 %
Lymphocytes Relative: 40 %
Lymphs Abs: 2.4 10*3/uL (ref 0.7–4.0)
MCH: 33.8 pg (ref 26.0–34.0)
MCHC: 34.5 g/dL (ref 30.0–36.0)
MCV: 98 fL (ref 80.0–100.0)
Monocytes Absolute: 0.3 10*3/uL (ref 0.1–1.0)
Monocytes Relative: 4 %
Neutro Abs: 3.3 10*3/uL (ref 1.7–7.7)
Neutrophils Relative %: 54 %
Platelets: 425 10*3/uL — ABNORMAL HIGH (ref 150–400)
RBC: 4.61 MIL/uL (ref 4.22–5.81)
RDW: 11.6 % (ref 11.5–15.5)
WBC: 6.1 10*3/uL (ref 4.0–10.5)
nRBC: 0 % (ref 0.0–0.2)

## 2024-01-05 LAB — RAPID URINE DRUG SCREEN, HOSP PERFORMED
Amphetamines: NOT DETECTED
Barbiturates: NOT DETECTED
Benzodiazepines: NOT DETECTED
Cocaine: NOT DETECTED
Opiates: NOT DETECTED
Tetrahydrocannabinol: POSITIVE — AB

## 2024-01-05 LAB — SALICYLATE LEVEL: Salicylate Lvl: <7 mg/dL — ABNORMAL LOW (ref 7.0–30.0)

## 2024-01-05 LAB — ACETAMINOPHEN LEVEL: Acetaminophen (Tylenol), Serum: <10 ug/mL — ABNORMAL LOW (ref 10–30)

## 2024-01-05 LAB — ETHANOL: Alcohol, Ethyl (B): 427 mg/dL (ref <15)

## 2024-01-05 MED ORDER — IBUPROFEN 200 MG PO TABS
600.0000 mg | ORAL_TABLET | Freq: Once | ORAL | Status: AC
  Administered 2024-01-05: 600 mg via ORAL
  Filled 2024-01-05: qty 3
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
## 2024-01-05 NOTE — Progress Notes (Signed)
 LCSW Progress Note:  MRN: [id number]  Kyle Webb  01/05/2024 11:54 am  Tommy Frames recommends inpatient hospitalization. Patient referred to [facility] for consideration of treatment.
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
## 2024-01-06 ENCOUNTER — Inpatient Hospital Stay (HOSPITAL_COMMUNITY)
Admission: AD | Admit: 2024-01-06 | Discharge: 2024-01-19 | DRG: 885 | Disposition: A | Discharge status: Home / Self Care | Source: Intra-hospital | Attending: Psychiatry | Admitting: Psychiatry

## 2024-01-06 ENCOUNTER — Encounter (HOSPITAL_COMMUNITY): Payer: Self-pay | Admitting: Nurse Practitioner

## 2024-01-06 ENCOUNTER — Other Ambulatory Visit: Payer: Self-pay

## 2024-01-06 DIAGNOSIS — Z8249 Family history of ischemic heart disease and other diseases of the circulatory system: Secondary | ICD-10-CM | POA: Diagnosis not present

## 2024-01-06 DIAGNOSIS — F1020 Alcohol dependence, uncomplicated: Secondary | ICD-10-CM | POA: Diagnosis present

## 2024-01-06 DIAGNOSIS — F329 Major depressive disorder, single episode, unspecified: Principal | ICD-10-CM | POA: Diagnosis present

## 2024-01-06 DIAGNOSIS — I10 Essential (primary) hypertension: Secondary | ICD-10-CM | POA: Diagnosis present

## 2024-01-06 DIAGNOSIS — R45851 Suicidal ideations: Secondary | ICD-10-CM | POA: Diagnosis present

## 2024-01-06 DIAGNOSIS — F17290 Nicotine dependence, other tobacco product, uncomplicated: Secondary | ICD-10-CM | POA: Diagnosis present

## 2024-01-06 DIAGNOSIS — F332 Major depressive disorder, recurrent severe without psychotic features: Secondary | ICD-10-CM | POA: Diagnosis not present

## 2024-01-06 DIAGNOSIS — F909 Attention-deficit hyperactivity disorder, unspecified type: Secondary | ICD-10-CM | POA: Diagnosis present

## 2024-01-06 DIAGNOSIS — F1290 Cannabis use, unspecified, uncomplicated: Secondary | ICD-10-CM | POA: Diagnosis present

## 2024-01-06 DIAGNOSIS — F419 Anxiety disorder, unspecified: Secondary | ICD-10-CM | POA: Diagnosis present

## 2024-01-06 DIAGNOSIS — Z79899 Other long term (current) drug therapy: Secondary | ICD-10-CM | POA: Diagnosis not present

## 2024-01-06 MED ORDER — LORAZEPAM 2 MG/ML IJ SOLN: 2.0000 mg | Freq: Three times a day (TID) | INTRAMUSCULAR | Status: DC | PRN

## 2024-01-06 MED ORDER — DIPHENHYDRAMINE HCL 50 MG/ML IJ SOLN: 50.0000 mg | Freq: Three times a day (TID) | INTRAMUSCULAR | Status: DC | PRN

## 2024-01-06 MED ORDER — ACETAMINOPHEN 325 MG PO TABS: 650.0000 mg | ORAL_TABLET | Freq: Four times a day (QID) | ORAL | Status: DC | PRN

## 2024-01-06 MED ORDER — ADULT MULTIVITAMIN W/MINERALS CH
1.0000 | ORAL_TABLET | Freq: Every day | ORAL | Status: DC
  Administered 2024-01-07 – 2024-01-19 (×13): 1 via ORAL
  Filled 2024-01-06 (×17): qty 1

## 2024-01-06 MED ORDER — HALOPERIDOL 5 MG PO TABS: 5.0000 mg | ORAL_TABLET | Freq: Three times a day (TID) | ORAL | Status: DC | PRN

## 2024-01-06 MED ORDER — CHLORDIAZEPOXIDE HCL 25 MG PO CAPS: 25.0000 mg | ORAL_CAPSULE | Freq: Four times a day (QID) | ORAL | Status: AC | PRN

## 2024-01-06 MED ORDER — DIPHENHYDRAMINE HCL 25 MG PO CAPS: 50.0000 mg | ORAL_CAPSULE | Freq: Three times a day (TID) | ORAL | Status: DC | PRN

## 2024-01-06 MED ORDER — HALOPERIDOL LACTATE 5 MG/ML IJ SOLN: 10.0000 mg | Freq: Three times a day (TID) | INTRAMUSCULAR | Status: DC | PRN

## 2024-01-06 MED ORDER — ONDANSETRON 4 MG PO TBDP: 4.0000 mg | ORAL_TABLET | Freq: Four times a day (QID) | ORAL | Status: AC | PRN

## 2024-01-06 MED ORDER — HYDROXYZINE HCL 25 MG PO TABS
25.0000 mg | ORAL_TABLET | Freq: Four times a day (QID) | ORAL | Status: AC | PRN
  Filled 2024-01-06: qty 1

## 2024-01-06 MED ORDER — MAGNESIUM HYDROXIDE 400 MG/5ML PO SUSP: 30.0000 mL | Freq: Every day | ORAL | Status: DC | PRN

## 2024-01-06 MED ORDER — LOPERAMIDE HCL 2 MG PO CAPS: 2.0000 mg | ORAL_CAPSULE | ORAL | Status: AC | PRN

## 2024-01-06 MED ORDER — ALUM & MAG HYDROXIDE-SIMETH 200-200-20 MG/5ML PO SUSP: 30.0000 mL | ORAL | Status: DC | PRN

## 2024-01-06 MED ORDER — HALOPERIDOL LACTATE 5 MG/ML IJ SOLN: 5.0000 mg | Freq: Three times a day (TID) | INTRAMUSCULAR | Status: DC | PRN

## 2024-01-06 NOTE — Progress Notes (Signed)
 Kyle Webb did not attend wrapup group.

## 2024-01-06 NOTE — ED Notes (Signed)
 Report called to [facility]. [employer] is taking another pt to the facility and will return to take this pt to [facility]

## 2024-01-06 NOTE — Progress Notes (Signed)
   01/06/24 1800  Psych Admission Type (Psych Patients Only)  Admission Status Involuntary  Psychosocial Assessment  Patient Complaints Anxiety;Insomnia;Irritability  Eye Contact Brief  Facial Expression Flat  Affect Flat  Speech Logical/coherent  Interaction Assertive  Motor Activity Other (Comment) (WNL)  Appearance/Hygiene In scrubs  Behavior Characteristics Cooperative;Appropriate to situation  Mood Anxious  Thought Process  Coherency WDL  Content WDL  Delusions None reported or observed  Perception WDL  Hallucination None reported or observed  Judgment WDL  Confusion WDL  Danger to Self  Current suicidal ideation? Denies  Description of Suicide Plan none  Self-Injurious Behavior No self-injurious ideation or behavior indicators observed or expressed   Agreement Not to Harm Self Yes  Description of Agreement verbal  Danger to Others  Danger to Others None reported or observed

## 2024-01-06 NOTE — Plan of Care (Signed)
   Problem: Education: Goal: Knowledge of [facility] Education information/materials will improve Outcome: Progressing Goal: Emotional status will improve Outcome: Progressing Goal: Mental status will improve Outcome: Progressing

## 2024-01-06 NOTE — Plan of Care (Signed)
   Problem: Activity: Goal: Sleeping patterns will improve Outcome: Progressing   Problem: Safety: Goal: Periods of time without injury will increase Outcome: Progressing

## 2024-01-06 NOTE — Progress Notes (Signed)
 Patient admitted to unit, denies SI, HI, AVH. Patient complained of anxiety, and heavy daily drinking after work for the past six months, patient denies having aspecific reason for his drinking, pt states "I work everyday hard, so I get to drink after work." Pt affect is flat, reluctant to answer questions. Patient oriented to unit, unit rules and schedule, pt is in his room sitting on the bed, denies any needs at this time. Pt instructed to come to staff if he has any self harm thoughts, pat verbalized understanding. Safety checks in place.

## 2024-01-06 NOTE — Progress Notes (Signed)
   01/06/24 2230  Psych Admission Type (Psych Patients Only)  Admission Status Involuntary  Psychosocial Assessment  Patient Complaints Anxiety  Eye Contact Brief  Facial Expression Flat  Affect Flat  Speech Logical/coherent  Interaction Assertive  Motor Activity Slow  Appearance/Hygiene In scrubs  Behavior Characteristics Cooperative  Mood Anxious  Aggressive Behavior  Effect No apparent injury  Thought Process  Coherency WDL  Content WDL  Delusions WDL  Perception WDL  Hallucination None reported or observed  Judgment WDL  Confusion WDL  Danger to Self  Current suicidal ideation? Denies  Danger to Others  Danger to Others None reported or observed

## 2024-01-06 NOTE — Progress Notes (Signed)
 Pt has been accepted to [facility] on 01/06/2024 Bed assignment: 404-1  Pt meets inpatient criteria per: Bonnita Buttner NP  Attending Physician will be: Dr. Alver Jobs, MD   Report can be called to: Adult unit: [phone]  Pt can arrive [facility] WILL UPDATE   Care Team Notified: [facility] Kathryn Parish RN, Lady Pier NP, Jacquelyn Blue RN, Phyliss Breen paramedic,     [place] Malisha Mabey LCSW-A   01/06/2024 10:54 AM

## 2024-01-06 NOTE — ED Provider Notes (Signed)
 Pt accepted to [facility], Dr Linnie Riches.   Pt is alert and awake, breathing comfortably. Vitals normal. No new c/o currently.  Pt appears stable for transfer to [facility].    Guadalupe Lee, MD 01/06/24 1122

## 2024-01-06 NOTE — ED Notes (Signed)
 One belongings bag placed in locker # [phone]

## 2024-01-06 NOTE — ED Provider Notes (Signed)
 Patient here under IVC by police with SI.  Seen by psych and felt to be appropriate for inpatient treatment, pending transfer  Vitals stable overnight  Etoh elevated on arrival yesterday and UDS positive for THC, but otherwise cleared medically  ECG personally interpreted  by myself - sinus rhythm, no evidence of dangerous arrhythmia   Wilder Amodei, Janalyn Me, MD 01/06/24 [phone]

## 2024-01-07 DIAGNOSIS — F1020 Alcohol dependence, uncomplicated: Secondary | ICD-10-CM | POA: Insufficient documentation

## 2024-01-07 MED ORDER — DIPHENHYDRAMINE HCL 25 MG PO CAPS: 50.0000 mg | ORAL_CAPSULE | Freq: Three times a day (TID) | ORAL | Status: DC | PRN

## 2024-01-07 MED ORDER — TRAZODONE HCL 50 MG PO TABS
50.0000 mg | ORAL_TABLET | Freq: Every evening | ORAL | Status: DC | PRN
  Filled 2024-01-07: qty 1

## 2024-01-07 MED ORDER — ALUM & MAG HYDROXIDE-SIMETH 200-200-20 MG/5ML PO SUSP: 30.0000 mL | Freq: Four times a day (QID) | ORAL | Status: DC | PRN

## 2024-01-07 MED ORDER — ACETAMINOPHEN 325 MG PO TABS: 650.0000 mg | ORAL_TABLET | Freq: Four times a day (QID) | ORAL | Status: DC | PRN

## 2024-01-07 MED ORDER — VITAMIN B-1 100 MG PO TABS
100.0000 mg | ORAL_TABLET | Freq: Every day | ORAL | Status: DC
  Administered 2024-01-07 – 2024-01-19 (×13): 100 mg via ORAL
  Filled 2024-01-07 (×16): qty 1

## 2024-01-07 MED ORDER — NALTREXONE HCL 50 MG PO TABS
50.0000 mg | ORAL_TABLET | Freq: Every day | ORAL | Status: DC
  Administered 2024-01-07 – 2024-01-13 (×7): 50 mg via ORAL
  Filled 2024-01-07 (×12): qty 1

## 2024-01-07 MED ORDER — HALOPERIDOL 5 MG PO TABS: 5.0000 mg | ORAL_TABLET | Freq: Three times a day (TID) | ORAL | Status: DC | PRN

## 2024-01-07 MED ORDER — NICOTINE 14 MG/24HR TD PT24
14.0000 mg | MEDICATED_PATCH | Freq: Every day | TRANSDERMAL | Status: DC
  Administered 2024-01-07 – 2024-01-09 (×3): 14 mg via TRANSDERMAL
  Filled 2024-01-07 (×16): qty 1

## 2024-01-07 NOTE — Plan of Care (Signed)

## 2024-01-07 NOTE — Progress Notes (Signed)
 D: Patient is alert, oriented, and cooperative. Denies SI, HI, AVH, and verbally contracts for safety. Patient reports he slept fair last night without sleeping medication. Patient reports his appetite as good, energy level as normal, and concentration as good. Patient rates his depression 3/10, hopelessness 0/10, and anxiety 3/10.    A: Scheduled medications administered per MD order. Support provided. Patient educated on safety on the unit and medications. Routine safety checks every 15 minutes. Patient stated understanding to tell nurse about any new physical symptoms. Patient understands to tell staff of any needs.     R: No adverse drug reactions noted. Patient remains safe at this time and will continue to monitor.    01/07/24 1000  Psych Admission Type (Psych Patients Only)  Admission Status Involuntary  Psychosocial Assessment  Patient Complaints Irritability  Eye Contact Fair  Facial Expression Animated  Affect Appropriate to circumstance  Speech Logical/coherent  Interaction Assertive  Motor Activity Other (Comment) (WNL)  Appearance/Hygiene Unremarkable  Behavior Characteristics Cooperative  Mood Anxious  Thought Process  Coherency WDL  Content WDL  Delusions None reported or observed  Perception WDL  Hallucination None reported or observed  Judgment Limited  Confusion None  Danger to Self  Current suicidal ideation? Denies  Self-Injurious Behavior No self-injurious ideation or behavior indicators observed or expressed   Agreement Not to Harm Self Yes  Description of Agreement verbal  Danger to Others  Danger to Others None reported or observed

## 2024-01-07 NOTE — Group Note (Signed)
 Occupational Therapy Group Note  Group Topic:Coping Skills  Group Date: 01/07/2024 Start Time: 1358 End Time: 1432 Facilitators: Lynnda Sas, OT   Group Description: Group encouraged increased engagement and participation through discussion and activity focused on "Coping Ahead." Patients were split up into teams and selected a card from a stack of positive coping strategies. Patients were instructed to act out/charade the coping skill for other peers to guess and receive points for their team. Discussion followed with a focus on identifying additional positive coping strategies and patients shared how they were going to cope ahead over the weekend while continuing hospitalization stay.  Therapeutic Goal(s): Identify positive vs negative coping strategies. Identify coping skills to be used during hospitalization vs coping skills outside of hospital/at home Increase participation in therapeutic group environment and promote engagement in treatment   Participation Level: Engaged   Participation Quality: Independent   Behavior: Appropriate   Speech/Thought Process: Relevant   Affect/Mood: Appropriate   Insight: Fair   Judgement: Fair      Modes of Intervention: Education  Patient Response to Interventions:  Attentive   Plan: Continue to engage patient in OT groups 2 - 3x/week.  01/07/2024  Lynnda Sas, OT  Yevette Knust, OT
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
## 2024-01-07 NOTE — Progress Notes (Signed)
   01/07/24 2130  Psych Admission Type (Psych Patients Only)  Admission Status Involuntary  Psychosocial Assessment  Patient Complaints None  Eye Contact Brief  Facial Expression Flat  Affect Flat  Speech Logical/coherent  Interaction Assertive  Motor Activity Slow  Appearance/Hygiene In scrubs  Behavior Characteristics Cooperative  Mood Anxious  Aggressive Behavior  Effect No apparent injury  Thought Process  Coherency WDL  Content WDL  Delusions WDL  Perception WDL  Hallucination None reported or observed  Judgment WDL  Confusion WDL  Danger to Self  Current suicidal ideation? Denies  Danger to Others  Danger to Others None reported or observed

## 2024-01-07 NOTE — Progress Notes (Signed)
 Adult Psychoeducational Group Note  Date:  01/07/2024 Time:  9:05 PM  Group Topic/Focus:  Wrap-Up Group:   The focus of this group is to help patients review their daily goal of treatment and discuss progress on daily workbooks.  Participation Level:  Active  Participation Quality:  Appropriate  Affect:  Appropriate  Cognitive:  Appropriate  Insight: Appropriate  Engagement in Group:  Engaged  Modes of Intervention:  Discussion  Additional Comments:  The group description of color light gray. 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  Kyle Webb 01/07/2024, 9:05 PM

## 2024-01-07 NOTE — Plan of Care (Signed)
   Problem: Education: Goal: Emotional status will improve Outcome: Progressing Goal: Mental status will improve Outcome: Progressing   Problem: Activity: Goal: Interest or engagement in activities will improve Outcome: Progressing Goal: Sleeping patterns will improve Outcome: Progressing   Problem: Safety: Goal: Periods of time without injury will increase Outcome: Progressing

## 2024-01-07 NOTE — [facility] Group Notes (Signed)
 The focus of this group is to help patients establish daily goals to achieve during treatment and discuss how the patient can incorporate goal setting into their daily lives to aide in recovery.          Scale 1-10 3   Goal: Keep calm cool and collected, Participating in all activities
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
## 2024-01-07 NOTE — Group Note (Signed)
 LCSW Group Therapy Note   Group Date: 01/07/2024 Start Time: 1100 End Time: 1200   Participation:  patient was present  Type of Therapy:  Group Therapy  Title: Healing Flames: Navigating Anger with Compassion  Objective:  Foster self-awareness and promote compassion toward oneself and others when dealing with anger.  Goals: Help participants understand the underlying emotions and needs fueling anger. Provide coping strategies for healthier emotional expression and anger management.  Summary: This session explored anger as a volcano - an explosion driven by deeper feelings and unmet needs. Participants learned to identify anger triggers and underlying emotions, then practiced coping strategies like deep breathing, physical activity, and journaling. The group discussed healthy ways to manage anger before it escalates, using both personal reflection and shared experiences.  Therapeutic Modalities: Cognitive Behavioral Therapy (CBT): Challenging thoughts that fuel anger. Mindfulness: Increasing awareness of emotions and sensations.   Kyle Webb O Ece [place], LCSWA 01/07/2024  1:04 PM

## 2024-01-08 ENCOUNTER — Encounter (HOSPITAL_COMMUNITY): Payer: Self-pay

## 2024-01-08 DIAGNOSIS — F332 Major depressive disorder, recurrent severe without psychotic features: Secondary | ICD-10-CM

## 2024-01-08 LAB — TSH: TSH: 2.734 u[IU]/mL (ref 0.350–4.500)

## 2024-01-08 LAB — HEMOGLOBIN A1C
Hgb A1c MFr Bld: 4.3 % — ABNORMAL LOW (ref 4.8–5.6)
Mean Plasma Glucose: 76.71 mg/dL

## 2024-01-08 LAB — LIPID PANEL
Cholesterol: 246 mg/dL — ABNORMAL HIGH (ref 0–200)
HDL: 112 mg/dL (ref >40)
LDL Cholesterol: 116 mg/dL — ABNORMAL HIGH (ref 0–99)
Total CHOL/HDL Ratio: 2.2 ratio
Triglycerides: 90 mg/dL (ref <150)
VLDL: 18 mg/dL (ref 0–40)

## 2024-01-08 MED ORDER — ARIPIPRAZOLE 5 MG PO TABS
5.0000 mg | ORAL_TABLET | Freq: Every day | ORAL | Status: DC
  Filled 2024-01-08 (×13): qty 1

## 2024-01-08 NOTE — [facility] Group Notes (Signed)
 Adult Psychoeducational Group Note  Date:  01/08/2024 Time:  10:28 AM  Group Topic/Focus:  Goals Group:   The focus of this group is to help patients establish daily goals to achieve during treatment and discuss how the patient can incorporate goal setting into their daily lives to aide in recovery. Orientation:   The focus of this group is to educate the patient on the purpose and policies of crisis stabilization and provide a format to answer questions about their admission.  The group details unit policies and expectations of patients while admitted.  Participation Level:  Active  Participation Quality:  Attentive  Affect:  Appropriate  Cognitive:  Appropriate  Insight: Appropriate  Engagement in Group:  Engaged  Modes of Intervention:  Discussion  Additional Comments:  Patient attended and participated in the goals group.  Becki Bouton 01/08/2024, 10:28 AM

## 2024-01-08 NOTE — Progress Notes (Signed)
   01/08/24 2015  Psych Admission Type (Psych Patients Only)  Admission Status Involuntary  Psychosocial Assessment  Patient Complaints None  Eye Contact Brief  Facial Expression Flat  Affect Flat  Speech Logical/coherent  Interaction Assertive  Motor Activity Slow  Appearance/Hygiene In scrubs  Behavior Characteristics Cooperative  Mood Anxious  Aggressive Behavior  Effect No apparent injury  Thought Process  Coherency WDL  Content WDL  Delusions WDL  Perception WDL  Hallucination None reported or observed  Judgment WDL  Confusion WDL  Danger to Self  Current suicidal ideation? Denies  Danger to Others  Danger to Others None reported or observed

## 2024-01-08 NOTE — Group Note (Signed)
 Recreation Therapy Group Note   Group Topic:Leisure Education  Group Date: 01/08/2024 Start Time: 0930 End Time: 1005 Facilitators: Ellinore Merced-McCall, LRT,CTRS Location: [address] Dayroom   Group Topic: Leisure Education   Goal Area(s) Addresses:  Patient will successfully identify positive leisure and recreation activities.  Patient will acknowledge benefits of participation in healthy leisure activities post discharge.  Patient will actively work with peers toward a shared goal.   Intervention: Cooperative Group Game    Activity: Keep It Contractor. Patients were seated in a circle and given a beach ball. Patients were to toss the ball back and forth between each other. Patients were to remain seated throughout group. LRT timed patients on how long they could keep the ball moving without it coming to a stop. If the ball came to a complete stop, LRT would restart the timer.   Education:  Teacher, English as a foreign language, Leisure as Merchant navy officer, Programmer, applications, Building control surveyor   Education Outcome: Acknowledges education/In group clarification offered/Needs additional education   Affect/Mood: Appropriate   Participation Level: Engaged   Participation Quality: Independent   Behavior: Appropriate   Speech/Thought Process: Focused   Insight: Good   Judgement: Good   Modes of Intervention: Cooperative Play   Patient Response to Interventions:  Engaged   Education Outcome:  In group clarification offered    Clinical Observations/Individualized Feedback: Pt came in late but joined right in with the activity. Pt was social, bright and engaging throughout group session. Pt worked well with peers in being successful with the activity.    Plan: Continue to engage patient in RT group sessions 2-3x/week.   Tristyn Pharris-McCall, LRT,CTRS 01/08/2024 1:21 PM
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
## 2024-01-08 NOTE — Progress Notes (Signed)
   01/08/24 0800  Psych Admission Type (Psych Patients Only)  Admission Status Involuntary  Psychosocial Assessment  Patient Complaints None  Eye Contact Brief  Facial Expression Flat  Affect Flat  Speech Logical/coherent  Interaction Assertive  Motor Activity Slow  Appearance/Hygiene In scrubs  Behavior Characteristics Cooperative;Appropriate to situation  Mood Anxious  Aggressive Behavior  Effect No apparent injury  Thought Process  Coherency WDL  Content WDL  Delusions WDL  Perception WDL  Hallucination None reported or observed  Judgment WDL  Confusion WDL  Danger to Self  Current suicidal ideation? Denies  Agreement Not to Harm Self Yes  Description of Agreement Verbal  Danger to Others  Danger to Others None reported or observed
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
## 2024-01-08 NOTE — Plan of Care (Signed)
   Problem: Activity: Goal: Interest or engagement in activities will improve Outcome: Progressing   Problem: Coping: Goal: Ability to verbalize frustrations and anger appropriately will improve Outcome: Progressing   Problem: Safety: Goal: Periods of time without injury will increase Outcome: Progressing

## 2024-01-08 NOTE — [facility] Suicide Risk Assessment (Signed)
 [facility] INPATIENT:  Family/Significant Other Suicide Prevention Education  Suicide Prevention Education:  Education Completed; father, Kyle Webb [phone] been identified by the patient as the family member/significant other with whom the patient will be residing, and identified as the person(s) who will aid the patient in the event of a mental health crisis (suicidal ideations/suicide attempt).  With written consent from the patient, the family member/significant other has been provided the following suicide prevention education, prior to the and/or following the discharge of the patient.  The suicide prevention education provided includes the following: Suicide risk factors Suicide prevention and interventions National Suicide Hotline telephone number [facility] assessment telephone number [facility] Emergency Assistance 911 [facility] and/or Residential Mobile Crisis Unit telephone number  Request made of family/significant other to: Remove weapons (e.g., guns, rifles, knives), all items previously/currently identified as safety concern.   Remove drugs/medications (over-the-counter, prescriptions, illicit drugs), all items previously/currently identified as a safety concern.  The family member/significant other verbalizes understanding of the suicide prevention education information provided.  The family member/significant other agrees to remove the items of safety concern listed above.  Kyle Webb 01/08/2024, 11:05 AM

## 2024-01-08 NOTE — Progress Notes (Signed)
  Kyle Webb   Patient declines any and all follow up appointments for both medication management and therapy. Patient also refusing outpatient or inpatient resources for alcohol  use at this time.    Signed:  Betrice Wanat, LCSW-A 01/08/2024  8:08 AM

## 2024-01-08 NOTE — BH IP Treatment Plan (Signed)
 Interdisciplinary Treatment and Diagnostic Plan Update  01/08/2024 Time of Session: 10:35 AM Kyle Webb MRN: [id number]  Principal Diagnosis: MDD (major depressive disorder), recurrent severe, without psychosis (HCC)  Secondary Diagnoses: Principal Problem:   MDD (major depressive disorder), recurrent severe, without psychosis (HCC) Active Problems:   Alcohol  use disorder, severe, dependence (HCC)   Current Medications:  Current Facility-Administered Medications  Medication Dose Route Frequency Provider Last Rate Last Admin   acetaminophen  (TYLENOL ) tablet 650 mg  650 mg Oral Q6H PRN Pashayan, Alexander S, MD       alum & mag hydroxide-simeth (MAALOX/MYLANTA) 200-200-20 MG/5ML suspension 30 mL  30 mL Oral Q6H PRN Basilia Bosworth, MD       ARIPiprazole  (ABILIFY ) tablet 5 mg  5 mg Oral Daily Chien, Stephanie, MD       chlordiazePOXIDE  (LIBRIUM ) capsule 25 mg  25 mg Oral Q6H PRN Onuoha, Chinwendu V, NP       haloperidol  (HALDOL ) tablet 5 mg  5 mg Oral TID PRN Basilia Bosworth, MD       And   diphenhydrAMINE  (BENADRYL ) capsule 50 mg  50 mg Oral TID PRN Basilia Bosworth, MD       hydrOXYzine  (ATARAX ) tablet 25 mg  25 mg Oral Q6H PRN Onuoha, Chinwendu V, NP       loperamide  (IMODIUM ) capsule 2-4 mg  2-4 mg Oral PRN Onuoha, Chinwendu V, NP       magnesium  hydroxide (MILK OF MAGNESIA) suspension 30 mL  30 mL Oral Daily PRN Onuoha, Chinwendu V, NP       multivitamin with minerals tablet 1 tablet  1 tablet Oral Daily Onuoha, Chinwendu V, NP   1 tablet at 01/08/24 0755   naltrexone  (DEPADE) tablet 50 mg  50 mg Oral Daily Pashayan, Alexander S, MD   50 mg at 01/08/24 7846   nicotine  (NICODERM CQ  - dosed in mg/24 hours) patch 14 mg  14 mg Transdermal Daily Linnie Riches, Nadir, MD   14 mg at 01/08/24 0757   ondansetron  (ZOFRAN -ODT) disintegrating tablet 4 mg  4 mg Oral Q6H PRN Onuoha, Chinwendu V, NP       thiamine  (Vitamin B-1) tablet 100 mg  100 mg Oral Daily Pashayan, Alexander S,  MD   100 mg at 01/08/24 9629   traZODone  (DESYREL ) tablet 50 mg  50 mg Oral QHS PRN Pashayan, Alexander S, MD       PTA Medications: Medications Prior to Admission  Medication Sig Dispense Refill Last Dose/Taking   amoxicillin -clavulanate (AUGMENTIN ) 875-125 MG tablet Take 1 tablet by mouth every 12 (twelve) hours. (Patient not taking: Reported on 01/05/2024) 14 tablet 0     Patient Stressors:    Patient Strengths:    Treatment Modalities: Medication Management, Group therapy, Case management,  1 to 1 session with clinician, Psychoeducation, Recreational therapy.   Physician Treatment Plan for Primary Diagnosis: MDD (major depressive disorder), recurrent severe, without psychosis (HCC) Long Term Goal(s): Improvement in symptoms so as ready for discharge   Short Term Goals: Ability to identify changes in lifestyle to reduce recurrence of condition will improve Ability to verbalize feelings will improve Ability to disclose and discuss suicidal ideas Ability to demonstrate self-control will improve Ability to identify and develop effective coping behaviors will improve Ability to maintain clinical measurements within normal limits will improve Compliance with prescribed medications will improve Ability to identify triggers associated with substance abuse/mental health issues will improve  Medication Management: Evaluate patient's response, side effects, and tolerance of  medication regimen.  Therapeutic Interventions: 1 to 1 sessions, Unit Group sessions and Medication administration.  Evaluation of Outcomes: Not Progressing  Physician Treatment Plan for Secondary Diagnosis: Principal Problem:   MDD (major depressive disorder), recurrent severe, without psychosis (HCC) Active Problems:   Alcohol  use disorder, severe, dependence (HCC)  Long Term Goal(s): Improvement in symptoms so as ready for discharge   Short Term Goals: Ability to identify changes in lifestyle to reduce  recurrence of condition will improve Ability to verbalize feelings will improve Ability to disclose and discuss suicidal ideas Ability to demonstrate self-control will improve Ability to identify and develop effective coping behaviors will improve Ability to maintain clinical measurements within normal limits will improve Compliance with prescribed medications will improve Ability to identify triggers associated with substance abuse/mental health issues will improve     Medication Management: Evaluate patient's response, side effects, and tolerance of medication regimen.  Therapeutic Interventions: 1 to 1 sessions, Unit Group sessions and Medication administration.  Evaluation of Outcomes: Not Progressing   RN Treatment Plan for Primary Diagnosis: MDD (major depressive disorder), recurrent severe, without psychosis (HCC) Long Term Goal(s): Knowledge of disease and therapeutic regimen to maintain health will improve  Short Term Goals: Ability to remain free from injury will improve, Ability to verbalize frustration and anger appropriately will improve, Ability to demonstrate self-control, Ability to participate in decision making will improve, Ability to verbalize feelings will improve, Ability to disclose and discuss suicidal ideas, Ability to identify and develop effective coping behaviors will improve, and Compliance with prescribed medications will improve  Medication Management: RN will administer medications as ordered by provider, will assess and evaluate patient's response and provide education to patient for prescribed medication. RN will report any adverse and/or side effects to prescribing provider.  Therapeutic Interventions: 1 on 1 counseling sessions, Psychoeducation, Medication administration, Evaluate responses to treatment, Monitor vital signs and CBGs as ordered, Perform/monitor CIWA, COWS, AIMS and Fall Risk screenings as ordered, Perform wound care treatments as  ordered.  Evaluation of Outcomes: Not Progressing   LCSW Treatment Plan for Primary Diagnosis: MDD (major depressive disorder), recurrent severe, without psychosis (HCC) Long Term Goal(s): Safe transition to appropriate next level of care at discharge, Engage patient in therapeutic group addressing interpersonal concerns.  Short Term Goals: Engage patient in aftercare planning with referrals and resources, Increase social support, Increase ability to appropriately verbalize feelings, Increase emotional regulation, Facilitate acceptance of mental health diagnosis and concerns, Facilitate patient progression through stages of change regarding substance use diagnoses and concerns, Identify triggers associated with mental health/substance abuse issues, and Increase skills for wellness and recovery  Therapeutic Interventions: Assess for all discharge needs, 1 to 1 time with Social worker, Explore available resources and support systems, Assess for adequacy in community support network, Educate family and significant other(s) on suicide prevention, Complete Psychosocial Assessment, Interpersonal group therapy.  Evaluation of Outcomes: Not Progressing   Progress in Treatment: Attending groups: Yes. Participating in groups: Yes. Taking medication as prescribed: Yes. Toleration medication: Yes. Family/Significant other contact made:  Yes, contacted father, Kyle Webb [phone] Patient understands diagnosis: Yes. Discussing patient identified problems/goals with staff: Yes. Medical problems stabilized or resolved: Yes. Denies suicidal/homicidal ideation: Yes. Issues/concerns per patient self-inventory: No.  New problem(s) identified:  No  New Short Term/Long Term Goal(s):    medication stabilization, elimination of SI thoughts, development of comprehensive mental wellness plan.   Patient Goals:  Patient recently admitted. CSW will continue to follow and assess for appropriate referrals and  possible discharge planning.     Discharge Plan or Barriers:   Reason for Continuation of Hospitalization: Depression Medication stabilization Suicidal ideation  Estimated Length of Stay:  5 - 7 days  Last 3 [place] Suicide Severity Risk Score: Flowsheet Row Admission (Current) [facility] from [facility]/05/2024 in [facility] ED from 12/26/2023 in [facility] Department at [facility]  C-SSRS RISK CATEGORY High Risk High Risk No Risk       Last PHQ 2/9 Scores:    05/21/2022    8:10 AM 03/03/2016    4:48 PM 03/15/2015   12:17 AM  Depression screen PHQ 2/9  Decreased Interest 0 0 0  Down, Depressed, Hopeless 0 0 0  PHQ - 2 Score 0 0 0  Altered sleeping  0 0  Tired, decreased energy  0 0  Change in appetite  1 0  Feeling bad or failure about yourself   0 0  Trouble concentrating  0 0  Moving slowly or fidgety/restless  0 0  Suicidal thoughts  0 0  PHQ-9 Score  1 0    Scribe for Treatment Team: Celine Dishman O Raynette Arras, LCSWA 01/08/2024 1:02 PM

## 2024-01-08 NOTE — Plan of Care (Signed)
  Problem: Education: Goal: Emotional status will improve Outcome: Progressing Goal: Mental status will improve Outcome: Progressing   Problem: Safety: Goal: Periods of time without injury will increase Outcome: Progressing   Problem: Activity: Goal: Interest or engagement in activities will improve Outcome: Not Progressing

## 2024-01-08 NOTE — Group Note (Signed)
 Date:  01/08/2024 Time:  8:22 PM  Group Topic/Focus:  Wrap-Up Group:   The focus of this group is to help patients review their daily goal of treatment and discuss progress on daily workbooks.    Participation Level:  Active  Participation Quality:  Appropriate and Attentive  Affect:  Appropriate  Cognitive:  Appropriate  Insight: Appropriate and Good  Engagement in Group:  Engaged  Modes of Intervention:  Discussion  Additional Comments:  Patient stated that he had a good day.  He had an 8 out of a 10.  Patient stated that his goal for today is to be more social  Moishe Angel 01/08/2024, 8:22 PM

## 2024-01-09 NOTE — Group Note (Signed)
 Date:  01/09/2024 Time:  9:10 AM  Group Topic/Focus:  Goals Group:   The focus of this group is to help patients establish daily goals to achieve during treatment and discuss how the patient can incorporate goal setting into their daily lives to aide in recovery. Orientation:   The focus of this group is to educate the patient on the purpose and policies of crisis stabilization and provide a format to answer questions about their admission.  The group details unit policies and expectations of patients while admitted.    Participation Level:  Active  Participation Quality:  Appropriate  Affect:  Appropriate  Cognitive:  Appropriate  Insight: Appropriate  Engagement in Group:  Engaged  Modes of Intervention:  Orientation  Additional Comments:    Violette Grief 01/09/2024, 9:10 AM

## 2024-01-09 NOTE — Progress Notes (Addendum)
 D. Pt has been visible in the milieu throughout the shift, observed attending groups. Pt's stated goal today is to work on "social indulgence with others, and relate with others, by socializing and smiling more often, and by accepting the past." Per pt's self inventory, pt rated his depression,hopelessness and anxiety all 0's. Pt refused his abilify  this morning stating, "I'm good."  Pt currently denies SI/HI and AVH and does not appear to be responding to internal stimuli. A. Labs and vitals monitored. Pt supported emotionally and encouraged to express concerns and ask questions.   R. Pt remains safe with 15 minute checks. Will continue POC.    01/09/24 1400  Psych Admission Type (Psych Patients Only)  Admission Status Involuntary  Psychosocial Assessment  Patient Complaints None  Eye Contact Brief  Facial Expression Flat  Affect Flat  Speech Logical/coherent  Interaction Assertive  Motor Activity Slow  Appearance/Hygiene Unremarkable  Behavior Characteristics Cooperative  Mood Anxious  Aggressive Behavior  Effect No apparent injury  Thought Process  Coherency WDL  Content WDL  Delusions WDL  Perception WDL  Hallucination None reported or observed  Judgment WDL  Confusion None  Danger to Self  Current suicidal ideation? Denies  Danger to Others  Danger to Others None reported or observed

## 2024-01-09 NOTE — Group Note (Signed)
 Date:  01/09/2024 Time:  10:58 PM  Group Topic/Focus:  Wrap-Up Group:   The focus of this group is to help patients review their daily goal of treatment and discuss progress on daily workbooks.    Participation Level:  Active  Participation Quality:  Appropriate  Affect:  Appropriate  Cognitive:  Appropriate  Insight: Appropriate  Engagement in Group:  Engaged  Modes of Intervention:  Discussion  Additional Comments:  Patient stated that his day was great and he has increased his social skills.  Patien stated he will use these skills  when he is disscharged   Kyle Webb 01/09/2024, 10:58 PM
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
## 2024-01-09 NOTE — [facility] Group Notes (Signed)
 LCSW Wellness Group Note   01/09/2024 13:30pm  Type of Group and Topic: Psychoeducational Group:  Wellness  Participation Level:  moderate  Description of Group  Wellness group introduces the topic and its focus on developing healthy habits across the spectrum and its relationship to a decrease in hospital admissions.  Six areas of wellness are discussed: physical, social spiritual, intellectual, occupational, and emotional.  Patients are asked to consider their current wellness habits and to identify areas of wellness where they are interested and able to focus on improvements.    Therapeutic Goals Patients will understand components of wellness and how they can positively impact overall health.  Patients will identify areas of wellness where they have developed good habits. Patients will identify areas of wellness where they would like to make improvements.    Summary of Patient Progress: pt attentive in group, did read one wellness area outloud to the group.  Not much participation in group discussion but did identify intellectual as positive wellness area and emotional/social as wellness areas that need work.      Therapeutic Modalities: Cognitive Behavioral Therapy Psychoeducation    Elspeth Hals, LCSW

## 2024-01-09 NOTE — Plan of Care (Signed)
   Problem: Education: Goal: Knowledge of [facility] Education information/materials will improve Outcome: Progressing Goal: Verbalization of understanding the information provided will improve Outcome: Progressing   Problem: Activity: Goal: Interest or engagement in activities will improve Outcome: Progressing

## 2024-01-09 NOTE — Plan of Care (Signed)
   Problem: Education: Goal: Knowledge of [facility] Education information/materials will improve Outcome: Progressing Goal: Emotional status will improve Outcome: Progressing Goal: Mental status will improve Outcome: Progressing Goal: Verbalization of understanding the information provided will improve Outcome: Progressing

## 2024-01-10 NOTE — Progress Notes (Signed)
   01/10/24 2200  Psych Admission Type (Psych Patients Only)  Admission Status Involuntary  Psychosocial Assessment  Patient Complaints None  Eye Contact Fair  Facial Expression Fixed smile  Affect Preoccupied  Speech Logical/coherent  Interaction Assertive  Motor Activity Slow  Appearance/Hygiene Unremarkable  Behavior Characteristics Cooperative  Mood Pleasant  Thought Process  Coherency WDL  Content WDL  Delusions WDL  Perception WDL  Hallucination None reported or observed  Judgment WDL  Confusion WDL  Danger to Self  Current suicidal ideation? Denies  Danger to Others  Danger to Others None reported or observed

## 2024-01-10 NOTE — Plan of Care (Signed)
   Problem: Education: Goal: Emotional status will improve Outcome: Progressing Goal: Mental status will improve Outcome: Progressing   Problem: Activity: Goal: Interest or engagement in activities will improve Outcome: Progressing Goal: Sleeping patterns will improve Outcome: Progressing   Problem: Safety: Goal: Periods of time without injury will increase Outcome: Progressing

## 2024-01-10 NOTE — Group Note (Signed)
 Date:  01/10/2024 Time:  1:19 PM  Group Topic/Focus:  Emotional Wellness:   The focus of this group is to identify unhealthy thought patterns and how to challenge them.     Participation Level:  Active  Participation Quality:  Appropriate  Affect:  Appropriate  Cognitive:  Appropriate  Insight: Appropriate  Engagement in Group:  Developing/Improving  Modes of Intervention:  Education and Exploration  Additional Comments:    Kristy Catoe D Jorge Amparo 01/10/2024, 1:19 PM

## 2024-01-10 NOTE — Progress Notes (Signed)
   01/10/24 1400  Psych Admission Type (Psych Patients Only)  Admission Status Involuntary  Psychosocial Assessment  Patient Complaints None  Eye Contact Fair  Facial Expression Fixed smile  Affect Preoccupied  Speech Logical/coherent  Interaction Assertive  Motor Activity Other (Comment) (steady gait)  Appearance/Hygiene Unremarkable  Behavior Characteristics Cooperative  Mood Pleasant  Thought Process  Coherency WDL  Content WDL  Delusions None reported or observed  Perception WDL  Hallucination None reported or observed  Judgment WDL  Confusion None  Danger to Self  Current suicidal ideation? Denies  Self-Injurious Behavior No self-injurious ideation or behavior indicators observed or expressed   Danger to Others  Danger to Others None reported or observed

## 2024-01-10 NOTE — [facility] Group Notes (Signed)
 The focus of this group is to help patients review their daily goal of treatment and discuss progress on daily workbooks. Pt was attentive and appropriate during tonight's wrap up group discussion. Pt rated overall day an 10. Pt stated that he was able to not think about his past so much. Pt shared didn't get into his head as much today.

## 2024-01-10 NOTE — Plan of Care (Signed)
  Problem: Education: Goal: Knowledge of [facility] Education information/materials will improve Outcome: Progressing Goal: Verbalization of understanding the information provided will improve Outcome: Progressing   Problem: Coping: Goal: Ability to verbalize frustrations and anger appropriately will improve Outcome: Progressing Goal: Ability to demonstrate self-control will improve Outcome: Progressing

## 2024-01-10 NOTE — Group Note (Signed)
 Date:  01/10/2024 Time:  9:29 AM  Group Topic/Focus:  Goals Group:   The focus of this group is to help patients establish daily goals to achieve during treatment and discuss how the patient can incorporate goal setting into their daily lives to aide in recovery. Orientation:   The focus of this group is to educate the patient on the purpose and policies of crisis stabilization and provide a format to answer questions about their admission.  The group details unit policies and expectations of patients while admitted.    Participation Level:  Minimal  Participation Quality:  Appropriate  Affect:  Appropriate  Cognitive:  Appropriate  Insight: Appropriate  Engagement in Group:  Developing/Improving  Modes of Intervention:  Discussion  Additional Comments:    Draxton Luu D Jazmon Kos 01/10/2024, 9:29 AM
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
## 2024-01-11 DIAGNOSIS — F332 Major depressive disorder, recurrent severe without psychotic features: Secondary | ICD-10-CM | POA: Diagnosis not present

## 2024-01-11 NOTE — Progress Notes (Signed)
   01/11/24 0831  Psych Admission Type (Psych Patients Only)  Admission Status Involuntary  Psychosocial Assessment  Patient Complaints None  Eye Contact Fair  Facial Expression Animated  Affect Preoccupied  Speech Logical/coherent  Interaction Assertive  Motor Activity Other (Comment) (WDL)  Appearance/Hygiene Unremarkable  Behavior Characteristics Cooperative  Mood Pleasant  Thought Process  Coherency WDL  Content WDL  Delusions None reported or observed  Perception WDL  Hallucination None reported or observed  Judgment WDL  Confusion None  Danger to Self  Current suicidal ideation? Denies  Agreement Not to Harm Self Yes  Description of Agreement Verbal  Danger to Others  Danger to Others None reported or observed
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
## 2024-01-11 NOTE — Plan of Care (Signed)
  Problem: Education: Goal: Emotional status will improve Outcome: Progressing Goal: Mental status will improve Outcome: Progressing Goal: Verbalization of understanding the information provided will improve Outcome: Progressing   Problem: Activity: Goal: Interest or engagement in activities will improve Outcome: Progressing   Problem: Safety: Goal: Periods of time without injury will increase Outcome: Progressing

## 2024-01-11 NOTE — Group Note (Signed)
 Date:  01/11/2024 Time:  8:56 AM  Group Topic/Focus:  Goals Group:   The focus of this group is to help patients establish daily goals to achieve during treatment and discuss how the patient can incorporate goal setting into their daily lives to aide in recovery.    Participation Level:  Active  Participation Quality:  Appropriate  Affect:  Appropriate   Ellan Gunner 01/11/2024, 8:56 AM

## 2024-01-11 NOTE — Group Note (Signed)
 Date:  01/11/2024 Time:  8:32 PM  Group Topic/Focus:  Wrap-Up Group:   The focus of this group is to help patients review their daily goal of treatment and discuss progress on daily workbooks.    Participation Level:  Active  Participation Quality:  Appropriate and Attentive  Affect:  Appropriate  Cognitive:  Alert and Appropriate  Insight: Appropriate and Good  Engagement in Group:  Engaged  Modes of Intervention:  Discussion and Education  Additional Comments:  Pt attended and participated in wrap up group this evening and rated their day a 9/10. Pt stated that they had some bumps that effected their day, but they were able to get through it. Pt completed their goal which was to "not think about the past". To help complete their goal, pt stated that they accepted the that they can not change the things that happened in the past. Pt states that they are only 25% open to going to IOP.   Eligah Grow 01/11/2024, 8:32 PM

## 2024-01-11 NOTE — [facility] Group Notes (Signed)
 Spiritual care group on grief and loss  Group Goal: *Support / Education around grief and loss *Members engage in facilitated group support and psycho-social education.  Group Description: Following introductions and group rules, group members engaged in facilitated group dialog and support around topic of loss, with particular support around experiences of loss in their lives. Group Identified types of loss (relationships / self / things) and identified patterns, circumstances, and changes that precipitate losses. Reflected on thoughts / feelings around loss, normalized grief responses, and recognized variety in grief experience. Group noted Worden's four tasks of grief in discussion.  Group drew on Adlerian / Rogerian, narrative, MI, and Yalom's theoretical frameworks of group dynamics.  Observations: Madelyne Schiff was an active participant in the group discussion.  Bodin Gorka [name] Aly, M.Div [phone]

## 2024-01-11 NOTE — Group Note (Signed)
 Recreation Therapy Group Note   Group Topic:Problem Solving  Group Date: 01/11/2024 Start Time: 0930 End Time: 1000 Facilitators: Carleen Rhue-McCall, LRT,CTRS Location: [address]   Group Topic: Communication, Team Building, Problem Solving  Goal Area(s) Addresses:  Patient will effectively work with peer towards shared goal.  Patient will identify skills used to make activity successful.  Patient will identify how skills used during activity can be used to reach post d/c goals.   Intervention: STEM Activity  Activity: Straw Bridge. In teams of 3-5, patients were given 15 plastic drinking straws and an equal length of masking tape. Using the materials provided, patients were instructed to build a free standing bridge-like structure to suspend an everyday item (ex: puzzle box) off of the floor or table surface. All materials were required to be used by the team in their design. LRT facilitated post-activity discussion reviewing team process. Patients were encouraged to reflect how the skills used in this activity can be generalized to daily life post discharge.   Education: Pharmacist, community, Scientist, physiological, Discharge Planning   Education Outcome: Acknowledges education/In group clarification offered/Needs additional education.    Affect/Mood: Appropriate   Participation Level: Engaged   Participation Quality: Independent   Behavior: Appropriate   Speech/Thought Process: Focused   Insight: Good   Judgement: Good   Modes of Intervention: STEM Activity   Patient Response to Interventions:  Engaged   Education Outcome:  In group clarification offered    Clinical Observations/Individualized Feedback: Pt was bright and appeared to take on the leadership role with his peers. Pt was engaged and focused throughout activity.     Plan: Continue to engage patient in RT group sessions 2-3x/week.   Shanisha Lech-McCall, LRT,CTRS  01/11/2024 1:07 PM

## 2024-01-12 NOTE — Plan of Care (Signed)

## 2024-01-12 NOTE — Progress Notes (Signed)
   01/11/24 2215  Psych Admission Type (Psych Patients Only)  Admission Status Involuntary  Psychosocial Assessment  Patient Complaints None  Eye Contact Fair  Facial Expression Animated  Affect Preoccupied  Speech Logical/coherent  Interaction Assertive  Motor Activity Other (Comment) (q15 minute checks)  Appearance/Hygiene Unremarkable  Behavior Characteristics Cooperative  Mood Pleasant  Thought Process  Coherency WDL  Content WDL  Delusions None reported or observed  Perception WDL  Hallucination None reported or observed  Judgment WDL  Confusion None  Danger to Self  Current suicidal ideation? Denies  Self-Injurious Behavior No self-injurious ideation or behavior indicators observed or expressed   Agreement Not to Harm Self Yes  Description of Agreement verbal  Danger to Others  Danger to Others None reported or observed

## 2024-01-12 NOTE — Group Note (Signed)
 Recreation Therapy Group Note   Group Topic:Animal Assisted Therapy   Group Date: 01/12/2024 Start Time: 0945 End Time: 1030 Facilitators: Chloeanne Poteet-McCall, LRT,CTRS Location: [address]  Animal-Assisted Activity (AAA) Program Checklist/Progress Notes Patient Eligibility Criteria Checklist & Daily Group note for Rec Tx Intervention  AAA/T Program Assumption of Risk Form signed by Patient/ or Parent Legal Guardian Yes  Patient is free of allergies or severe asthma Yes  Patient reports no fear of animals Yes  Patient reports no history of cruelty to animals No  Patient understands his/her participation is voluntary Yes  Patient washes hands before animal contact Yes  Patient washes hands after animal contact Yes  Education: Hand Washing, Appropriate Animal Interaction   Education Outcome: Acknowledges education.    Affect/Mood: N/A   Participation Level: Did not attend    Clinical Observations/Individualized Feedback:    Plan: Continue to engage patient in RT group sessions 2-3x/week.   Kyle Webb, LRT,CTRS 01/12/2024 1:06 PM

## 2024-01-12 NOTE — Progress Notes (Signed)
 Adult Psychoeducational Group Note  Date:  01/12/2024 Time:  8:38 PM  Group Topic/Focus:  Wrap-Up Group:   The focus of this group is to help patients review their daily goal of treatment and discuss progress on daily workbooks.  Participation Level:  Active  Participation Quality:  Appropriate  Affect:  Appropriate  Cognitive:  Appropriate  Insight: Appropriate  Engagement in Group:  Engaged  Modes of Intervention:  Discussion  Additional Comments:  [name] said he open up today and found something in common with same issues.  Kyle Webb Long 01/12/2024, 8:38 PM

## 2024-01-12 NOTE — Group Note (Signed)
 LCSW Group Therapy Note   Group Date: 01/11/2024 Start Time: 1100 End Time: 1200   Participation:  did not attend  Type of Therapy:  Group Therapy  Topic:  Healing Hearts:  A Safe Space for Grief  Objective:  To create a compassionate group space where participants can process grief, learn about its stages, and explore personal rituals to honor lost loved ones.   Goals:  1. Provide a safe and supportive space where participants feel comfortable sharing their feelings and experiences of grief without judgment.  2. Educate participants about the stages of grief and emphasize that there is no "right" way to grieve or a fixed timeline for healing.  3. Introduce the concept of rituals to process grief, allowing individuals to honor their loved ones in a personal and meaningful way.  Summary:  In Healing Hearts: A Safe Space for Grief, participants explored the unique and personal nature of grief, with a focus on the five stages (denial, anger, bargaining, depression, acceptance) as a flexible, non-linear process. The group introduced meaningful rituals - like lighting candles or memory walks - as tools for honoring loved ones and managing difficult emotions. Through shared experiences, participants received emotional support, practiced self-care, and reflected on gratitude, emphasizing that healing has no fixed timeline.  Therapeutic Modalities Used: Cognitive Behavioral Therapy (CBT):   Psychoeducation on grief stages, Challenging unhelpful thoughts (e.g., "I should be over this") Dialectical Behavior Therapy (DBT):  Mindfulness (staying present with grief-related emotions), Distress tolerance (using rituals as grounding tools) Group Therapy/Supportive Elements:  Emotional validation and peer support, Encouragement of open sharing in a nonjudgmental space   Micajah Dennin O Rannie Craney, LCSWA 01/12/2024  12:30 PM

## 2024-01-12 NOTE — Plan of Care (Signed)
   Problem: Education: Goal: Emotional status will improve Outcome: Progressing Goal: Mental status will improve Outcome: Progressing   Problem: Activity: Goal: Interest or engagement in activities will improve Outcome: Progressing

## 2024-01-12 NOTE — Progress Notes (Signed)
 D: Patient is alert, oriented, pleasant, and cooperative. Denies SI, HI, AVH, and verbally contracts for safety. Patient reports he slept good last night without sleeping medication. Patient reports his appetite as good, energy level as normal, and concentration as good. Patient rates his depression 0/10, hopelessness 1/10, and anxiety 1/10. Patient denies physical symptoms/pain.    A: Patient refused abilify  this morning. Other scheduled medications administered per MD order. Support provided. Patient educated on safety on the unit and medications. Routine safety checks every 15 minutes. Patient stated understanding to tell nurse about any new physical symptoms. Patient understands to tell staff of any needs.     R: No adverse drug reactions noted. Patient remains safe at this time and will continue to monitor.    01/12/24 1200  Psych Admission Type (Psych Patients Only)  Admission Status Involuntary  Psychosocial Assessment  Patient Complaints None  Eye Contact Fair  Facial Expression Animated  Affect Preoccupied  Speech Logical/coherent  Interaction Assertive  Motor Activity Other (Comment) (WNL)  Appearance/Hygiene Unremarkable  Behavior Characteristics Cooperative  Mood Pleasant  Thought Process  Coherency WDL  Content WDL  Delusions None reported or observed  Perception WDL  Hallucination None reported or observed  Judgment Impaired  Confusion None  Danger to Self  Current suicidal ideation? Denies  Self-Injurious Behavior No self-injurious ideation or behavior indicators observed or expressed   Agreement Not to Harm Self Yes  Description of Agreement verbal  Danger to Others  Danger to Others None reported or observed

## 2024-01-12 NOTE — [facility] Group Notes (Signed)
 Adult Psychoeducational Group Note  Date:  01/12/2024 Time:  12:09 PM  Group Topic/Focus:  Goals Group:   The focus of this group is to help patients establish daily goals to achieve during treatment and discuss how the patient can incorporate goal setting into their daily lives to aide in recovery.  Participation Level:  Active  Participation Quality:  Appropriate  Affect:  Appropriate  Cognitive:  Appropriate  Insight: Appropriate  Engagement in Group:  Engaged  Modes of Intervention:  Orientation  Additional Comments:  Pt goal for today is to be more open minded  Kyle Webb 01/12/2024, 12:09 PM
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
## 2024-01-12 NOTE — Progress Notes (Signed)
  Kyle Webb   Type of Note: Discharge planning  Spoke with patient this afternoon regarding plans for discharge being that he is unable to return home. Pt reports he does not know where he will go and that he has not made any phone calls. Pt encouraged to begin making arrangements so something is in place at time of discharge. Pt agreeable.    Will continue to assist as needed.  Signed:  Arvell Pulsifer, LCSW-A 01/12/2024  1:46 PM

## 2024-01-12 NOTE — Progress Notes (Signed)
   01/12/24 2015  Psych Admission Type (Psych Patients Only)  Admission Status Involuntary  Psychosocial Assessment  Patient Complaints None  Eye Contact Fair  Facial Expression Animated  Affect Preoccupied  Speech Logical/coherent  Interaction Assertive  Motor Activity Slow  Appearance/Hygiene Unremarkable  Behavior Characteristics Cooperative  Mood Pleasant  Aggressive Behavior  Effect No apparent injury  Thought Process  Coherency WDL  Content WDL  Delusions WDL  Perception WDL  Hallucination None reported or observed  Judgment Impaired  Confusion WDL  Danger to Self  Current suicidal ideation? Denies  Danger to Others  Danger to Others None reported or observed

## 2024-01-13 ENCOUNTER — Encounter (HOSPITAL_COMMUNITY): Payer: Self-pay

## 2024-01-13 NOTE — Plan of Care (Signed)
   Problem: Education: Goal: Emotional status will improve Outcome: Progressing Goal: Mental status will improve Outcome: Progressing   Problem: Activity: Goal: Interest or engagement in activities will improve Outcome: Progressing Goal: Sleeping patterns will improve Outcome: Progressing

## 2024-01-13 NOTE — Group Note (Signed)
 Recreation Therapy Group Note   Group Topic:Other  Group Date: 01/13/2024 Start Time: 1415 End Time: 1450 Facilitators: Korrina Zern-McCall, LRT,CTRS Location: [address] Dayroom   Activity Description/Intervention: Therapeutic Drumming. Patients with peers and staff were given the opportunity to engage in a leader facilitated [facility] Empowerment Drumming Circle with staff from the [employer], in partnership with [employer]. Teaching laboratory technician and trained [employer], Kathlyne Parchment leading with LRT observing and documenting intervention and pt response. This evidenced-based practice targets 7 areas of health and wellbeing in the human experience including: stress-reduction, exercise, self-expression, camaraderie/support, nurturing, spirituality, and music-making (leisure).   Goal Area(s) Addresses:  Patient will engage in pro-social way in music group.  Patient will follow directions of drum leader on the first prompt. Patient will demonstrate no behavioral issues during group.  Patient will identify if a reduction in stress level occurs as a result of participation in therapeutic drum circle.    Education: Leisure exposure, Pharmacologist, Musical expression, Discharge Planning   Affect/Mood: Appropriate   Participation Level: Engaged   Participation Quality: Independent   Behavior: Appropriate   Speech/Thought Process: Focused   Insight: Good   Judgement: Good   Modes of Intervention: Teaching laboratory technician   Patient Response to Interventions:  Engaged   Education Outcome:  In group clarification offered    Clinical Observations/Individualized Feedback: Hermann actively engaged in therapeutic drumming exercise and discussions. Pt was appropriate with peers, staff, and musical equipment for duration of programming.  Pt identified "enlightened" as their feeling after participation in music-based programming. Pt affect congruent/incongruent  with verbalized emotion.    Plan: Continue to engage patient in RT group sessions 2-3x/week.   Keller Mikels-McCall, LRT,CTRS 01/13/2024 4:06 PM
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
## 2024-01-13 NOTE — BH IP Treatment Plan (Signed)
 Interdisciplinary Treatment and Diagnostic Plan Update  01/13/2024 Time of Session: 1125AM - UPDATE AZIM BAK MRN: [id number]  Principal Diagnosis: MDD (major depressive disorder), recurrent severe, without psychosis (HCC)  Secondary Diagnoses: Principal Problem:   MDD (major depressive disorder), recurrent severe, without psychosis (HCC) Active Problems:   Alcohol  use disorder, severe, dependence (HCC)   Current Medications:  Current Facility-Administered Medications  Medication Dose Route Frequency Provider Last Rate Last Admin   acetaminophen  (TYLENOL ) tablet 650 mg  650 mg Oral Q6H PRN Pashayan, Alexander S, MD       alum & mag hydroxide-simeth (MAALOX/MYLANTA) 200-200-20 MG/5ML suspension 30 mL  30 mL Oral Q6H PRN Basilia Bosworth, MD       ARIPiprazole  (ABILIFY ) tablet 5 mg  5 mg Oral Daily Chien, Stephanie, MD       haloperidol  (HALDOL ) tablet 5 mg  5 mg Oral TID PRN Basilia Bosworth, MD       And   diphenhydrAMINE  (BENADRYL ) capsule 50 mg  50 mg Oral TID PRN Basilia Bosworth, MD       magnesium  hydroxide (MILK OF MAGNESIA) suspension 30 mL  30 mL Oral Daily PRN Onuoha, Chinwendu V, NP       multivitamin with minerals tablet 1 tablet  1 tablet Oral Daily Onuoha, Chinwendu V, NP   1 tablet at 01/13/24 0752   naltrexone  (DEPADE) tablet 50 mg  50 mg Oral Daily Pashayan, Alexander S, MD   50 mg at 01/13/24 0752   nicotine  (NICODERM CQ  - dosed in mg/24 hours) patch 14 mg  14 mg Transdermal Daily Attiah, Nadir, MD   14 mg at 01/09/24 0802   thiamine  (Vitamin B-1) tablet 100 mg  100 mg Oral Daily Pashayan, Alexander S, MD   100 mg at 01/13/24 0752   traZODone  (DESYREL ) tablet 50 mg  50 mg Oral QHS PRN Pashayan, Alexander S, MD       PTA Medications: Medications Prior to Admission  Medication Sig Dispense Refill Last Dose/Taking   amoxicillin -clavulanate (AUGMENTIN ) 875-125 MG tablet Take 1 tablet by mouth every 12 (twelve) hours. (Patient not taking: Reported on  01/05/2024) 14 tablet 0     Patient Stressors:    Patient Strengths:    Treatment Modalities: Medication Management, Group therapy, Case management,  1 to 1 session with clinician, Psychoeducation, Recreational therapy.   Physician Treatment Plan for Primary Diagnosis: MDD (major depressive disorder), recurrent severe, without psychosis (HCC) Long Term Goal(s): Improvement in symptoms so as ready for discharge   Short Term Goals: Ability to identify changes in lifestyle to reduce recurrence of condition will improve Ability to verbalize feelings will improve Ability to disclose and discuss suicidal ideas Ability to demonstrate self-control will improve Ability to identify and develop effective coping behaviors will improve Ability to maintain clinical measurements within normal limits will improve Compliance with prescribed medications will improve Ability to identify triggers associated with substance abuse/mental health issues will improve  Medication Management: Evaluate patient's response, side effects, and tolerance of medication regimen.  Therapeutic Interventions: 1 to 1 sessions, Unit Group sessions and Medication administration.  Evaluation of Outcomes: Progressing  Physician Treatment Plan for Secondary Diagnosis: Principal Problem:   MDD (major depressive disorder), recurrent severe, without psychosis (HCC) Active Problems:   Alcohol  use disorder, severe, dependence (HCC)  Long Term Goal(s): Improvement in symptoms so as ready for discharge   Short Term Goals: Ability to identify changes in lifestyle to reduce recurrence of condition will improve Ability to verbalize  feelings will improve Ability to disclose and discuss suicidal ideas Ability to demonstrate self-control will improve Ability to identify and develop effective coping behaviors will improve Ability to maintain clinical measurements within normal limits will improve Compliance with prescribed  medications will improve Ability to identify triggers associated with substance abuse/mental health issues will improve     Medication Management: Evaluate patient's response, side effects, and tolerance of medication regimen.  Therapeutic Interventions: 1 to 1 sessions, Unit Group sessions and Medication administration.  Evaluation of Outcomes: Progressing   RN Treatment Plan for Primary Diagnosis: MDD (major depressive disorder), recurrent severe, without psychosis (HCC) Long Term Goal(s): Knowledge of disease and therapeutic regimen to maintain health will improve  Short Term Goals: Ability to remain free from injury will improve, Ability to demonstrate self-control, Ability to participate in decision making will improve, Ability to verbalize feelings will improve, and Ability to identify and develop effective coping behaviors will improve  Medication Management: RN will administer medications as ordered by provider, will assess and evaluate patient's response and provide education to patient for prescribed medication. RN will report any adverse and/or side effects to prescribing provider.  Therapeutic Interventions: 1 on 1 counseling sessions, Psychoeducation, Medication administration, Evaluate responses to treatment, Monitor vital signs and CBGs as ordered, Perform/monitor CIWA, COWS, AIMS and Fall Risk screenings as ordered, Perform wound care treatments as ordered.  Evaluation of Outcomes: Progressing   LCSW Treatment Plan for Primary Diagnosis: MDD (major depressive disorder), recurrent severe, without psychosis (HCC) Long Term Goal(s): Safe transition to appropriate next level of care at discharge, Engage patient in therapeutic group addressing interpersonal concerns.  Short Term Goals: Engage patient in aftercare planning with referrals and resources, Facilitate acceptance of mental health diagnosis and concerns, Identify triggers associated with mental health/substance abuse  issues, and Increase skills for wellness and recovery  Therapeutic Interventions: Assess for all discharge needs, 1 to 1 time with Social worker, Explore available resources and support systems, Assess for adequacy in community support network, Educate family and significant other(s) on suicide prevention, Complete Psychosocial Assessment, Interpersonal group therapy.  Evaluation of Outcomes: Progressing   Progress in Treatment: Attending groups: Yes. Participating in groups: Yes. Taking medication as prescribed: Yes (some). Toleration medication: Yes. Family/Significant other contact made:  Yes, contacted father, Merel Karaman [phone] Patient understands diagnosis: Yes. Discussing patient identified problems/goals with staff: Yes. Medical problems stabilized or resolved: Yes. Denies suicidal/homicidal ideation: Yes. Issues/concerns per patient self-inventory: No.   New problem(s) identified:  No   New Short Term/Long Term Goal(s):     medication stabilization, elimination of SI thoughts, development of comprehensive mental wellness plan.    Patient Goals:  Patient recently admitted. CSW will continue to follow and assess for appropriate referrals and possible discharge planning.      Discharge Plan or Barriers:    Reason for Continuation of Hospitalization: Depression Medication stabilization Suicidal ideation   Estimated Length of Stay:  5 - 7 days; early to mid next week  Last 3 [place] Suicide Severity Risk Score: Flowsheet Row Ad[facility] (Current) [facility] from 01/05/2024 in [facility] at [facility] from 12/26/2023 in [facility] Department at [facility]  C-SSRS RISK CATEGORY High Risk High Risk No Risk       Last [facility] 2/9 Scores:    05/21/2022    8:10 AM 03/03/2016    4:48 PM 03/15/2015   12:17 AM  Depression screen PHQ  2/9  Decreased Interest 0 0 0   Down, Depressed, Hopeless 0 0 0  PHQ - 2 Score 0 0 0  Altered sleeping  0 0  Tired, decreased energy  0 0  Change in appetite  1 0  Feeling bad or failure about yourself   0 0  Trouble concentrating  0 0  Moving slowly or fidgety/restless  0 0  Suicidal thoughts  0 0  PHQ-9 Score  1 0    Scribe for Treatment Team: Rheta Celestine 01/13/2024 11:32 AM

## 2024-01-13 NOTE — Plan of Care (Signed)
   Problem: Activity: Goal: Interest or engagement in activities will improve Outcome: Progressing Goal: Sleeping patterns will improve Outcome: Progressing   Problem: Coping: Goal: Ability to verbalize frustrations and anger appropriately will improve Outcome: Progressing Goal: Ability to demonstrate self-control will improve Outcome: Progressing   Problem: Safety: Goal: Periods of time without injury will increase Outcome: Progressing   Problem: Physical Regulation: Goal: Ability to maintain clinical measurements within normal limits will improve Outcome: Progressing

## 2024-01-13 NOTE — Progress Notes (Signed)
  Adult Psychoeducational Group Note  Date:  01/13/2024 Time:  8:30 PM  Group Topic/Focus:  Wrap-Up Group:   The focus of this group is to help patients review their daily goal of treatment and discuss progress on daily workbooks.  Participation Level:  Active  Participation Quality:  Appropriate  Affect:  Appropriate  Cognitive:  Appropriate  Insight: Appropriate  Engagement in Group:  Engaged  Modes of Intervention:  Discussion  Additional Comments:  The one positive thing that happen today noone flip out  Kyle Webb 01/13/2024, 8:30 PM

## 2024-01-13 NOTE — Progress Notes (Signed)
   01/13/24 2000  Psych Admission Type (Psych Patients Only)  Admission Status Involuntary  Psychosocial Assessment  Patient Complaints None  Eye Contact Fair  Facial Expression Animated  Affect Preoccupied  Speech Logical/coherent  Interaction Assertive  Motor Activity Slow  Appearance/Hygiene Unremarkable  Behavior Characteristics Cooperative  Mood Anxious;Pleasant  Aggressive Behavior  Effect No apparent injury  Thought Process  Coherency WDL  Content WDL  Delusions WDL  Perception WDL  Hallucination None reported or observed  Judgment Impaired  Confusion WDL  Danger to Self  Current suicidal ideation? Denies  Danger to Others  Danger to Others None reported or observed

## 2024-01-13 NOTE — Progress Notes (Signed)
   01/13/24 0855  Psych Admission Type (Psych Patients Only)  Admission Status Involuntary  Psychosocial Assessment  Patient Complaints None  Eye Contact Fair  Facial Expression Animated  Affect Anxious  Speech Logical/coherent  Interaction Assertive  Motor Activity Other (Comment) (WNL)  Appearance/Hygiene Unremarkable  Behavior Characteristics Cooperative  Mood Anxious;Pleasant  Thought Process  Coherency WDL  Content WDL  Delusions None reported or observed  Perception WDL  Hallucination None reported or observed  Judgment Impaired  Confusion None  Danger to Self  Current suicidal ideation? Denies  Agreement Not to Harm Self Yes  Description of Agreement Verbal  Danger to Others  Danger to Others None reported or observed

## 2024-01-13 NOTE — Group Note (Signed)
 Date:  01/13/2024 Time:  10:45 AM  Group Topic/Focus:  Emotional Education:   The focus of this group is to discuss what feelings/emotions are, and how they are experienced. Goals Group:   The focus of this group is to help patients establish daily goals to achieve during treatment and discuss how the patient can incorporate goal setting into their daily lives to aide in recovery. Orientation:   The focus of this group is to educate the patient on the purpose and policies of crisis stabilization and provide a format to answer questions about their admission.  The group details unit policies and expectations of patients while admitted.    Participation Level:  Active  Participation Quality:  Appropriate  Affect:  Appropriate  Cognitive:  Appropriate  Insight: Good  Engagement in Group:  Engaged  Modes of Intervention:  Discussion  Additional Comments:  Pt goal is to become more social.    Almarie Arias 01/13/2024, 10:45 AM

## 2024-01-14 NOTE — Group Note (Signed)
 Date:  01/14/2024 Time:  8:55 AM  Group Topic/Focus:  Goals Group:   The focus of this group is to help patients establish daily goals to achieve during treatment and discuss how the patient can incorporate goal setting into their daily lives to aide in recovery.    Participation Level:  Active  Participation Quality:  Appropriate  Affect:  Appropriate   Kyle Webb 01/14/2024, 8:55 AM

## 2024-01-14 NOTE — Plan of Care (Signed)
   Problem: Education: Goal: Emotional status will improve Outcome: Progressing Goal: Mental status will improve Outcome: Progressing   Problem: Activity: Goal: Interest or engagement in activities will improve Outcome: Progressing Goal: Sleeping patterns will improve Outcome: Progressing

## 2024-01-14 NOTE — Group Note (Signed)
 LCSW Group Therapy Note   Group Date: 01/14/2024 Start Time: 1100 End Time: 1200  Participation:  patient was present and actively participated in the conversation.  Type of Therapy:  Group Therapy  Title:  Speaking from the Heart: Communicating with Understanding and Empathy  Objective:  To help participants develop effective communication skills to express themselves clearly, listen actively, and navigate conflicts in a healthy way. Goals: Increase awareness of verbal and non-verbal communication skills. Practice using "I" statements and active listening techniques. Learn coping strategies for managing communication stress.  Summary:  Participants explored the importance of communication, discussed challenges, and practiced skills such as active listening and assertive expression. They reflected on past experiences and identified ways to improve communication in their daily lives.  Therapeutic Modalities: Cognitive-Behavioral Therapy (CBT): Restructuring negative thought patterns in communication. Mindfulness: Staying present and calm during conversations. Psychoeducation: Learning about effective communication techniques.   Kamarius Buckbee O Jacory Kamel, LCSWA 01/14/2024  1:06 PM

## 2024-01-14 NOTE — Plan of Care (Signed)
  Problem: Education: Goal: Emotional status will improve 01/14/2024 2233 by Silvio Dry, RN Outcome: Progressing 01/14/2024 2103 by Silvio Dry, RN Outcome: Progressing Goal: Mental status will improve 01/14/2024 2233 by Silvio Dry, RN Outcome: Progressing 01/14/2024 2103 by Silvio Dry, RN Outcome: Progressing   Problem: Activity: Goal: Interest or engagement in activities will improve 01/14/2024 2233 by Silvio Dry, RN Outcome: Progressing 01/14/2024 2103 by Silvio Dry, RN Outcome: Progressing Goal: Sleeping patterns will improve 01/14/2024 2233 by Silvio Dry, RN Outcome: Progressing 01/14/2024 2103 by Silvio Dry, RN Outcome: Progressing

## 2024-01-14 NOTE — Progress Notes (Signed)
 D: Patient is alert, oriented, pleasant, and cooperative. Denies SI, HI, AVH, and verbally contracts for safety. Patient reports he slept good last night without sleeping medication. Patient reports his appetite as good, energy level as high, and concentration as good. Patient rates his depression 0/10, hopelessness 0/10, and anxiety 0/10. Patient denies physical symptoms/pain.    A: Patient refused abilify  and naltrexone  this morning. All other scheduled medications administered per MD order. Support provided. Patient educated on safety on the unit and medications. Routine safety checks every 15 minutes. Patient stated understanding to tell nurse about any new physical symptoms. Patient understands to tell staff of any needs.     R: No adverse drug reactions noted. Patient remains safe at this time and will continue to monitor.    01/14/24 1100  Psych Admission Type (Psych Patients Only)  Admission Status Involuntary  Psychosocial Assessment  Patient Complaints None  Eye Contact Fair  Facial Expression Animated  Affect Preoccupied  Speech Logical/coherent  Interaction Assertive  Motor Activity Other (Comment) (WNL)  Appearance/Hygiene Unremarkable  Behavior Characteristics Cooperative  Mood Pleasant  Thought Process  Coherency WDL  Content WDL  Delusions None reported or observed  Perception WDL  Hallucination None reported or observed  Judgment Impaired  Confusion None  Danger to Self  Current suicidal ideation? Denies  Self-Injurious Behavior No self-injurious ideation or behavior indicators observed or expressed   Agreement Not to Harm Self Yes  Description of Agreement verbal  Danger to Others  Danger to Others None reported or observed
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
## 2024-01-14 NOTE — Progress Notes (Signed)
   01/14/24 2100  Psych Admission Type (Psych Patients Only)  Admission Status Involuntary  Psychosocial Assessment  Patient Complaints None  Eye Contact Fair  Facial Expression Animated  Affect Preoccupied  Speech Logical/coherent  Interaction Assertive  Motor Activity Slow  Appearance/Hygiene Unremarkable  Behavior Characteristics Cooperative  Mood Pleasant  Aggressive Behavior  Effect No apparent injury  Thought Process  Coherency WDL  Content WDL  Delusions WDL  Perception WDL  Hallucination None reported or observed  Judgment Impaired  Confusion WDL  Danger to Self  Current suicidal ideation? Denies  Danger to Others  Danger to Others None reported or observed

## 2024-01-14 NOTE — [facility] Group Notes (Signed)
 [facility] Group Notes:  (Nursing/MHT/Case Management/Adjunct)  Date:  01/14/2024  Time:  2000  Type of Therapy:  Wrap up group  Participation Level:  Active  Participation Quality:  Appropriate, Attentive, Sharing, and Supportive  Affect:  Appropriate  Cognitive:  Alert  Insight:  Improving  Engagement in Group:  Engaged  Modes of Intervention:  Clarification, Education, and Support  Summary of Progress/Problems: Positive thinking and positive change were discussed,  Catharine Clock 01/14/2024, 9:41 PM

## 2024-01-14 NOTE — Plan of Care (Signed)

## 2024-01-15 DIAGNOSIS — F332 Major depressive disorder, recurrent severe without psychotic features: Secondary | ICD-10-CM | POA: Diagnosis not present

## 2024-01-15 LAB — HEPATITIS PANEL, ACUTE
HCV Ab: NONREACTIVE
Hep A IgM: NONREACTIVE
Hep B C IgM: NONREACTIVE
Hepatitis B Surface Ag: NONREACTIVE

## 2024-01-15 LAB — COMPREHENSIVE METABOLIC PANEL WITH GFR
ALT: 13 U/L (ref 0–44)
AST: 18 U/L (ref 15–41)
Albumin: 4.1 g/dL (ref 3.5–5.0)
Alkaline Phosphatase: 35 U/L — ABNORMAL LOW (ref 38–126)
Anion gap: 8 (ref 5–15)
BUN: 14 mg/dL (ref 6–20)
CO2: 26 mmol/L (ref 22–32)
Calcium: 9 mg/dL (ref 8.9–10.3)
Chloride: 104 mmol/L (ref 98–111)
Creatinine, Ser: 0.48 mg/dL — ABNORMAL LOW (ref 0.61–1.24)
GFR, Estimated: >60 mL/min (ref >60)
Glucose, Bld: 88 mg/dL (ref 70–99)
Potassium: 3.9 mmol/L (ref 3.5–5.1)
Sodium: 138 mmol/L (ref 135–145)
Total Bilirubin: 0.6 mg/dL (ref 0.0–1.2)
Total Protein: 7.1 g/dL (ref 6.5–8.1)

## 2024-01-15 MED ORDER — ACAMPROSATE CALCIUM 333 MG PO TBEC
666.0000 mg | DELAYED_RELEASE_TABLET | Freq: Three times a day (TID) | ORAL | Status: DC
  Filled 2024-01-15 (×18): qty 2

## 2024-01-15 NOTE — Progress Notes (Signed)
 DAR NOTE: Patient presents with a calm  affect and mood.  Denies suicidal thoughts, pain, auditory and visual hallucinations.  Rates depression at 0, hopelessness at 0, and anxiety at 0.  Maintained on routine safety checks.  Medications given as prescribed.  Support and encouragement offered as needed.  Attended group and participated.  States goal for today is "focus on a discharge plan and communicate better."  Patient observed socializing with peers in the dayroom.  Offered no complaint.

## 2024-01-15 NOTE — Plan of Care (Signed)
   Problem: Education: Goal: Emotional status will improve Outcome: Not Progressing Goal: Mental status will improve Outcome: Not Progressing

## 2024-01-15 NOTE — Group Note (Signed)
 Date:  01/15/2024 Time:  2:20 PM  Group Topic/Focus:  Healthy Support Systems:   The focus of this group is to discuss boundaries and healthy ways to communicate them.    Participation Level:  Active  Participation Quality:  Appropriate  Affect:  Appropriate  Cognitive:  Appropriate  Insight: Appropriate  Engagement in Group:  Engaged  Modes of Intervention:  Discussion  Additional Comments:    [place] Behlke D Kalley Nicholl 01/15/2024, 2:20 PM
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
## 2024-01-15 NOTE — Progress Notes (Signed)
   01/15/24 1100  Psych Admission Type (Psych Patients Only)  Admission Status Involuntary  Psychosocial Assessment  Patient Complaints None  Eye Contact Fair  Facial Expression Animated  Affect Preoccupied  Speech Logical/coherent  Interaction Assertive  Motor Activity Slow  Appearance/Hygiene Unremarkable  Behavior Characteristics Cooperative  Mood Pleasant  Thought Process  Coherency WDL  Content WDL  Delusions WDL  Perception WDL  Hallucination None reported or observed  Judgment Impaired  Confusion None  Danger to Self  Current suicidal ideation? Denies  Danger to Others  Danger to Others None reported or observed

## 2024-01-15 NOTE — Group Note (Signed)
 Date:  01/15/2024 Time:  9:09 AM  Group Topic/Focus:  Goals Group:   The focus of this group is to help patients establish daily goals to achieve during treatment and discuss how the patient can incorporate goal setting into their daily lives to aide in recovery. Orientation:   The focus of this group is to educate the patient on the purpose and policies of crisis stabilization and provide a format to answer questions about their admission.  The group details unit policies and expectations of patients while admitted.    Participation Level:  Active  Participation Quality:  Appropriate  Affect:  Appropriate  Cognitive:  Appropriate  Insight: Appropriate  Engagement in Group:  Engaged  Modes of Intervention:  Discussion and Orientation  Additional Comments:    Kyle Webb [name] 01/15/2024, 9:09 AM

## 2024-01-15 NOTE — [facility] Group Notes (Signed)
 Spirituality Group   Description: Participant directed exploration of values, beliefs and meaning   Following a brief framework of chaplain's role and ground rules of group behavior, participants are invited to share concerns or questions that engage spiritual life. Emphasis placed on common themes and shared experiences and ways to make meaning and clarify living into one's values.   Theory/Process/Goal: Utilize the theoretical framework of group therapy established by Derrell Flight, Relational Cultural Theory and Rogerian approaches to facilitate relational empathy and use of the "here and now" to foster reflection, self-awareness, and sharing.   Observations: Kyle Webb was an actively engaged participant in the group discussion.  Garlin Batdorf [name] Aly, M.Div [phone]

## 2024-01-16 DIAGNOSIS — F332 Major depressive disorder, recurrent severe without psychotic features: Secondary | ICD-10-CM | POA: Diagnosis not present

## 2024-01-16 NOTE — Group Note (Signed)
 LCSW Group Therapy Note  @TD @      Type of Therapy and Topic:  Group Therapy: Compassion  Participation Level:  Active   Description of Group:   In this group, patients shared and discussed the importance of acknowledging the elements in their lives for which they are grateful and how this can positively impact their mood.  The group discussed how to recognize that they use a lot of harsh self-talk as a coping mechanism instead of showing yourself compassion.  The group was encourage to narrate how they would talk to their self  by using compassion and how they have been harsh to their self.   Therapeutic Goals: Patients will identify one or more way they can be compassionate to their self   Patients will discuss how harshness can deter the way they think Patients narrate of being harsh and how to show compassion to their self.    Summary of Patient Progress:  The patient shared that he is harsh for not being aware of those he care for in his life.  Patient stated that he is more compassionate to himself by taking the time to process his thoughts.  Therapeutic Modalities:   Solution-Focused Therapy Activity Sonnie Pawloski O Taquisha Phung, LCSWA 01/16/2024  4:09 PM
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
## 2024-01-16 NOTE — Group Note (Signed)
 Date:  01/16/2024 Time:  9:15 PM  Group Topic/Focus:  Wrap-Up Group:   The focus of this group is to help patients review their daily goal of treatment and discuss progress on daily workbooks.    Participation Level:  Minimal  Participation Quality:  Appropriate  Affect:  Appropriate  Cognitive:  Appropriate  Insight: Improving  Engagement in Group:  Limited  Modes of Intervention:  Discussion  Additional Comments:  Pt attended the evening wrap-up group. Tech introduced the staff for the evening, reminded group of the evening schedule and reminded them to ask for anything they need. Pt read and discussed a poem named "Autobiography in Five Short Chapters".  Kyle Webb 01/16/2024, 9:15 PM

## 2024-01-16 NOTE — Plan of Care (Signed)
   Problem: Education: Goal: Emotional status will improve Outcome: Progressing Goal: Mental status will improve Outcome: Progressing   Problem: Activity: Goal: Interest or engagement in activities will improve Outcome: Progressing

## 2024-01-16 NOTE — Progress Notes (Signed)
   01/16/24 1200  Psych Admission Type (Psych Patients Only)  Admission Status Involuntary  Psychosocial Assessment  Patient Complaints None  Eye Contact Fair  Facial Expression Animated  Affect Preoccupied  Speech Logical/coherent  Interaction Assertive  Motor Activity Slow  Appearance/Hygiene Unremarkable  Behavior Characteristics Cooperative  Mood Pleasant  Thought Process  Coherency WDL  Content WDL  Delusions WDL  Perception WDL  Hallucination None reported or observed  Judgment Impaired  Confusion None  Danger to Self  Current suicidal ideation? Denies  Danger to Others  Danger to Others None reported or observed

## 2024-01-17 DIAGNOSIS — F332 Major depressive disorder, recurrent severe without psychotic features: Secondary | ICD-10-CM | POA: Diagnosis not present
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
## 2024-01-17 NOTE — Progress Notes (Addendum)
 D. Pt has been visible in the milieu, observed attending groups and interacting appropriately with peers. Pt has been friendly upon approach, continues to refuse all meds except for vitamins. Pt reported sleeping well last night, described his concentration and appetite as 'good', and energy level as 'high'. Per pt's self inventory, pt rated his depression,hopelessness and anxiety all 0's. Pt's stated goal today is "to think before I speak." Pt currently denies SI/HI and AVH and does not appear to be responding to internal stimuli.  A. Labs and vitals monitored.Pt supported emotionally and encouraged to express concerns and ask questions.   R. Pt remains safe with 15 minute checks. Will continue POC.    01/17/24 1000  Psych Admission Type (Psych Patients Only)  Admission Status Involuntary  Psychosocial Assessment  Patient Complaints None  Eye Contact Fair  Facial Expression Animated  Affect Appropriate to circumstance  Speech Logical/coherent  Interaction Assertive  Motor Activity Other (Comment) (steady gait)  Appearance/Hygiene Unremarkable  Behavior Characteristics Cooperative  Mood Pleasant  Thought Process  Coherency WDL  Content WDL  Delusions None reported or observed  Perception WDL  Hallucination None reported or observed  Judgment Poor  Confusion None  Danger to Self  Current suicidal ideation? Denies  Danger to Others  Danger to Others None reported or observed

## 2024-01-17 NOTE — Group Note (Signed)
 Date:  01/17/2024 Time:  4:24 PM  Group Topic/Focus:  Dimensions of Wellness:   The focus of this group is to introduce the topic of wellness and discuss the role each dimension of wellness plays in total health.    Participation Level:  Active  Participation Quality:  Appropriate and Attentive  Affect:  Appropriate  Cognitive:  Alert and Appropriate  Insight: Appropriate and Good  Engagement in Group:  Engaged  Modes of Intervention:  Activity, Discussion, Exploration, Rapport Building, Socialization, and Support  Additional Comments:   Victorine Grater 01/17/2024, 4:24 PM

## 2024-01-17 NOTE — Progress Notes (Signed)
   01/17/24 2237  Psych Admission Type (Psych Patients Only)  Admission Status Involuntary  Psychosocial Assessment  Patient Complaints None  Eye Contact Fair  Facial Expression Animated  Affect Appropriate to circumstance  Speech Logical/coherent  Interaction Assertive  Motor Activity Other (Comment) (WDL)  Appearance/Hygiene Unremarkable  Behavior Characteristics Appropriate to situation  Mood Pleasant  Thought Process  Coherency WDL  Content WDL  Delusions None reported or observed  Perception WDL  Hallucination None reported or observed  Judgment Poor  Confusion None  Danger to Self  Current suicidal ideation? Denies  Self-Injurious Behavior No self-injurious ideation or behavior indicators observed or expressed   Agreement Not to Harm Self Yes  Description of Agreement verbal  Danger to Others  Danger to Others None reported or observed

## 2024-01-17 NOTE — Plan of Care (Signed)
   Problem: Education: Goal: Emotional status will improve Outcome: Progressing Goal: Mental status will improve Outcome: Progressing

## 2024-01-17 NOTE — [facility] Counselor (Signed)
 CSW follow up with provider request to know about the patient IVC extension letter with the court. The advocate stated that the provider needs to get the patient to sign the the 24 hr extention and he will follow up with it. CSW informed provider.

## 2024-01-17 NOTE — [facility] Group Notes (Signed)
 [facility] Group Notes:  (Nursing/MHT/Case Management/Adjunct)  Date:  01/17/2024  Time:  9:21 PM  Type of Therapy:  Wrap-up group  Participation Level:  Active  Participation Quality:  Appropriate  Affect:  Appropriate  Cognitive:  Appropriate  Insight:  Appropriate  Engagement in Group:  Engaged  Modes of Intervention:  Education  Summary of Progress/Problems: Goal to think before speaking. Rated his day 10/10.  Kyle Webb 01/17/2024, 9:21 PM

## 2024-01-18 ENCOUNTER — Encounter (HOSPITAL_COMMUNITY): Payer: Self-pay

## 2024-01-18 NOTE — [facility] Group Notes (Signed)
 [facility] Group Notes:  (Nursing/MHT/Case Management/Adjunct)  Date:  01/18/2024  Time:  12:02 PM  Type of Therapy:  Group Therapy  Participation Level:  Active  Participation Quality:  Appropriate and Attentive  Affect:  Appropriate  Cognitive:  Alert and Appropriate  Insight:  Appropriate and Good  Engagement in Group:  Engaged  Modes of Intervention:  Activity, Discussion, Socialization, and Support  Summary of Progress/Problems: Patient participated and was engaged in group activities (Taboo, Picture nary, questionnaire and Charade) Kyle Webb 01/18/2024, 12:02 PM

## 2024-01-18 NOTE — BH IP Treatment Plan (Signed)
 Interdisciplinary Treatment and Diagnostic Plan Update  01/18/2024 Time of Session: 01/18/24 - UPDATE Kyle Webb MRN: [id number]  Principal Diagnosis: MDD (major depressive disorder), recurrent severe, without psychosis (HCC)  Secondary Diagnoses: Principal Problem:   MDD (major depressive disorder), recurrent severe, without psychosis (HCC) Active Problems:   Alcohol  use disorder, severe, dependence (HCC)   Current Medications:  Current Facility-Administered Medications  Medication Dose Route Frequency Provider Last Rate Last Admin   acamprosate (CAMPRAL) tablet 666 mg  666 mg Oral TID Zouev, Dmitri, MD       acetaminophen  (TYLENOL ) tablet 650 mg  650 mg Oral Q6H PRN Pashayan, Alexander S, MD       alum & mag hydroxide-simeth (MAALOX/MYLANTA) 200-200-20 MG/5ML suspension 30 mL  30 mL Oral Q6H PRN Basilia Bosworth, MD       ARIPiprazole  (ABILIFY ) tablet 5 mg  5 mg Oral Daily Chien, Stephanie, MD       haloperidol  (HALDOL ) tablet 5 mg  5 mg Oral TID PRN Basilia Bosworth, MD       And   diphenhydrAMINE  (BENADRYL ) capsule 50 mg  50 mg Oral TID PRN Basilia Bosworth, MD       magnesium  hydroxide (MILK OF MAGNESIA) suspension 30 mL  30 mL Oral Daily PRN Onuoha, Chinwendu V, NP       multivitamin with minerals tablet 1 tablet  1 tablet Oral Daily Onuoha, Chinwendu V, NP   1 tablet at 01/18/24 0825   nicotine  (NICODERM CQ  - dosed in mg/24 hours) patch 14 mg  14 mg Transdermal Daily Attiah, Nadir, MD   14 mg at 01/09/24 0802   thiamine  (Vitamin B-1) tablet 100 mg  100 mg Oral Daily Pashayan, Alexander S, MD   100 mg at 01/18/24 0825   traZODone  (DESYREL ) tablet 50 mg  50 mg Oral QHS PRN Pashayan, Alexander S, MD       PTA Medications: Medications Prior to Admission  Medication Sig Dispense Refill Last Dose/Taking   amoxicillin -clavulanate (AUGMENTIN ) 875-125 MG tablet Take 1 tablet by mouth every 12 (twelve) hours. (Patient not taking: Reported on 01/05/2024) 14 tablet 0      Patient Stressors:    Patient Strengths:    Treatment Modalities: Medication Management, Group therapy, Case management,  1 to 1 session with clinician, Psychoeducation, Recreational therapy.   Physician Treatment Plan for Primary Diagnosis: MDD (major depressive disorder), recurrent severe, without psychosis (HCC) Long Term Goal(s): Improvement in symptoms so as ready for discharge   Short Term Goals: Ability to identify changes in lifestyle to reduce recurrence of condition will improve Ability to verbalize feelings will improve Ability to disclose and discuss suicidal ideas Ability to demonstrate self-control will improve Ability to identify and develop effective coping behaviors will improve Ability to maintain clinical measurements within normal limits will improve Compliance with prescribed medications will improve Ability to identify triggers associated with substance abuse/mental health issues will improve  Medication Management: Evaluate patient's response, side effects, and tolerance of medication regimen.  Therapeutic Interventions: 1 to 1 sessions, Unit Group sessions and Medication administration.  Evaluation of Outcomes: Progressing  Physician Treatment Plan for Secondary Diagnosis: Principal Problem:   MDD (major depressive disorder), recurrent severe, without psychosis (HCC) Active Problems:   Alcohol  use disorder, severe, dependence (HCC)  Long Term Goal(s): Improvement in symptoms so as ready for discharge   Short Term Goals: Ability to identify changes in lifestyle to reduce recurrence of condition will improve Ability to verbalize feelings will improve Ability  to disclose and discuss suicidal ideas Ability to demonstrate self-control will improve Ability to identify and develop effective coping behaviors will improve Ability to maintain clinical measurements within normal limits will improve Compliance with prescribed medications will improve Ability  to identify triggers associated with substance abuse/mental health issues will improve     Medication Management: Evaluate patient's response, side effects, and tolerance of medication regimen.  Therapeutic Interventions: 1 to 1 sessions, Unit Group sessions and Medication administration.  Evaluation of Outcomes: Progressing   RN Treatment Plan for Primary Diagnosis: MDD (major depressive disorder), recurrent severe, without psychosis (HCC) Long Term Goal(s): Knowledge of disease and therapeutic regimen to maintain health will improve  Short Term Goals: Ability to demonstrate self-control, Ability to disclose and discuss suicidal ideas, and Ability to identify and develop effective coping behaviors will improve  Medication Management: RN will administer medications as ordered by provider, will assess and evaluate patient's response and provide education to patient for prescribed medication. RN will report any adverse and/or side effects to prescribing provider.  Therapeutic Interventions: 1 on 1 counseling sessions, Psychoeducation, Medication administration, Evaluate responses to treatment, Monitor vital signs and CBGs as ordered, Perform/monitor CIWA, COWS, AIMS and Fall Risk screenings as ordered, Perform wound care treatments as ordered.  Evaluation of Outcomes: Progressing   LCSW Treatment Plan for Primary Diagnosis: MDD (major depressive disorder), recurrent severe, without psychosis (HCC) Long Term Goal(s): Safe transition to appropriate next level of care at discharge, Engage patient in therapeutic group addressing interpersonal concerns.  Short Term Goals: Engage patient in aftercare planning with referrals and resources, Increase emotional regulation, and Facilitate patient progression through stages of change regarding substance use diagnoses and concerns  Therapeutic Interventions: Assess for all discharge needs, 1 to 1 time with Social worker, Explore available resources and  support systems, Assess for adequacy in community support network, Educate family and significant other(s) on suicide prevention, Complete Psychosocial Assessment, Interpersonal group therapy.  Evaluation of Outcomes: Progressing   Progress in Treatment: Attending groups: Yes. Participating in groups: Yes. Taking medication as prescribed: Yes (some). Toleration medication: Yes. Family/Significant other contact made:  Yes, contacted father, Zoel Bassani [phone] Patient understands diagnosis: Yes. Discussing patient identified problems/goals with staff: Yes. Medical problems stabilized or resolved: Yes. Denies suicidal/homicidal ideation: Yes. Issues/concerns per patient self-inventory: No.   New problem(s) identified:  No   New Short Term/Long Term Goal(s):  medication stabilization, elimination of SI thoughts, development of comprehensive mental wellness plan.    Patient Goals:  Patient recently admitted. CSW will continue to follow and assess for appropriate referrals and possible discharge planning.      Discharge Plan or Barriers:    Reason for Continuation of Hospitalization: Depression Medication stabilization Suicidal ideation   Estimated Length of Stay:  5 - 7 days; early to mid next week  Last 3 [place] Suicide Severity Risk Score: Flowsheet Row Ad[facility] (Current) [facility] from 01/05/2024 in [facility] at [facility] from 12/26/2023 in [facility] at [facility]  C-SSRS RISK CATEGORY High Risk High Risk No Risk       Last PHQ 2/9 Scores:    05/21/2022    8:10 AM 03/03/2016    4:48 PM 03/15/2015   12:17 AM  Depression screen PHQ 2/9  Decreased Interest 0 0 0  Down, Depressed, Hopeless 0 0 0  PHQ - 2 Score 0 0 0  Altered sleeping  0 0  Tired, decreased energy  0 0  Change in appetite  1 0  Feeling bad or failure about yourself   0 0   Trouble concentrating  0 0  Moving slowly or fidgety/restless  0 0  Suicidal thoughts  0 0  PHQ-9 Score  1 0    Scribe for Treatment Team: Chae Oommen N Phenix Grein, LCSW 01/18/2024 9:43 AM

## 2024-01-18 NOTE — Plan of Care (Signed)
   Problem: Activity: Goal: Interest or engagement in activities will improve Outcome: Progressing   Problem: Coping: Goal: Ability to verbalize frustrations and anger appropriately will improve Outcome: Progressing   Problem: Safety: Goal: Periods of time without injury will increase Outcome: Progressing
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
## 2024-01-18 NOTE — [facility] Group Notes (Signed)
 [facility] Group Notes:  (Nursing/MHT/Case Management/Adjunct)  Date:  01/18/2024  Time:  9:17 AM  Type of Therapy:  Nurse Education  Participation Level:  Active  Participation Quality:  Appropriate and Attentive  Affect:  Appropriate  Cognitive:  Alert and Appropriate  Insight:  Appropriate  Engagement in Group:  Engaged  Modes of Intervention:  Discussion and Education  Summary of Progress/Problems: discussed goals, expectation and proper hygiene.  Patient was attentive and participated. Kyle Webb 01/18/2024, 9:17 AM

## 2024-01-18 NOTE — Group Note (Signed)
 Occupational Therapy Group Note  Group Topic:Coping Skills  Group Date: 01/18/2024 Start Time: 1430 End Time: 1500 Facilitators: Lynnda Sas, OT   Group Description: Group encouraged increased engagement and participation through discussion and activity focused on "Coping Ahead." Patients were split up into teams and selected a card from a stack of positive coping strategies. Patients were instructed to act out/charade the coping skill for other peers to guess and receive points for their team. Discussion followed with a focus on identifying additional positive coping strategies and patients shared how they were going to cope ahead over the weekend while continuing hospitalization stay.  Therapeutic Goal(s): Identify positive vs negative coping strategies. Identify coping skills to be used during hospitalization vs coping skills outside of hospital/at home Increase participation in therapeutic group environment and promote engagement in treatment   Participation Level: Engaged   Participation Quality: Independent   Behavior: Appropriate   Speech/Thought Process: Relevant   Affect/Mood: Appropriate   Insight: Fair   Judgement: Fair      Modes of Intervention: Education  Patient Response to Interventions:  Attentive   Plan: Continue to engage patient in OT groups 2 - 3x/week.  01/18/2024  Lynnda Sas, OT  Tovah Slavick, OT

## 2024-01-18 NOTE — Progress Notes (Signed)
   01/18/24 0530  15 Minute Checks  Location Hallway  Visual Appearance Calm  Behavior Composed  Sleep (Behavioral Health Patients Only)  Calculate sleep? (Click Yes once per 24 hr at 0600 safety check) Yes  Documented sleep last 24 hours 6.75

## 2024-01-18 NOTE — Plan of Care (Signed)
   Problem: Education: Goal: Emotional status will improve Outcome: Progressing   Problem: Education: Goal: Verbalization of understanding the information provided will improve Outcome: Progressing

## 2024-01-18 NOTE — [facility] Group Notes (Signed)
 [facility] Group Notes:  (Nursing/MHT/Case Management/Adjunct)  Date:  01/18/2024  Time:  2000  Type of Therapy:  Alcoholics Anonymous Meeting  Participation Level:  Active  Participation Quality:  Appropriate, Attentive, Sharing, and Supportive  Affect:  Appropriate  Cognitive:  Alert  Insight:  Improving  Engagement in Group:  Engaged  Modes of Intervention:  Clarification, Education, and Support  Summary of Progress/Problems:  Kyle Webb 01/18/2024, 9:44 PM

## 2024-01-18 NOTE — Progress Notes (Signed)
   01/18/24 0828  Psych Admission Type (Psych Patients Only)  Admission Status Involuntary  Psychosocial Assessment  Patient Complaints None  Eye Contact Fair  Facial Expression Animated  Affect Appropriate to circumstance  Speech Logical/coherent  Interaction Assertive  Motor Activity Other (Comment) (WDL)  Appearance/Hygiene Unremarkable  Behavior Characteristics Appropriate to situation  Mood Pleasant  Thought Process  Coherency WDL  Content WDL  Delusions None reported or observed  Perception WDL  Hallucination None reported or observed  Judgment Poor  Confusion None  Danger to Self  Current suicidal ideation? Denies  Self-Injurious Behavior No self-injurious ideation or behavior indicators observed or expressed   Agreement Not to Harm Self Yes  Description of Agreement verbal  Danger to Others  Danger to Others None reported or observed

## 2024-01-19 ENCOUNTER — Inpatient Hospital Stay (HOSPITAL_COMMUNITY)
Admission: AD | Admit: 2024-01-19 | Discharge: 2024-02-02 | DRG: 885 | Disposition: A | Discharge status: Home / Self Care | Source: Intra-hospital | Attending: Psychiatry | Admitting: Psychiatry

## 2024-01-19 ENCOUNTER — Encounter (HOSPITAL_COMMUNITY): Payer: Self-pay | Admitting: Psychiatry

## 2024-01-19 ENCOUNTER — Other Ambulatory Visit: Payer: Self-pay

## 2024-01-19 DIAGNOSIS — Z8249 Family history of ischemic heart disease and other diseases of the circulatory system: Secondary | ICD-10-CM | POA: Diagnosis not present

## 2024-01-19 DIAGNOSIS — T506X6A Underdosing of antidotes and chelating agents, initial encounter: Secondary | ICD-10-CM | POA: Diagnosis not present

## 2024-01-19 DIAGNOSIS — Z91128 Patient's intentional underdosing of medication regimen for other reason: Secondary | ICD-10-CM | POA: Diagnosis not present

## 2024-01-19 DIAGNOSIS — F909 Attention-deficit hyperactivity disorder, unspecified type: Secondary | ICD-10-CM | POA: Diagnosis present

## 2024-01-19 DIAGNOSIS — I10 Essential (primary) hypertension: Secondary | ICD-10-CM | POA: Diagnosis not present

## 2024-01-19 DIAGNOSIS — F419 Anxiety disorder, unspecified: Secondary | ICD-10-CM | POA: Diagnosis not present

## 2024-01-19 DIAGNOSIS — F1020 Alcohol dependence, uncomplicated: Secondary | ICD-10-CM | POA: Diagnosis not present

## 2024-01-19 DIAGNOSIS — F4310 Post-traumatic stress disorder, unspecified: Secondary | ICD-10-CM | POA: Diagnosis present

## 2024-01-19 DIAGNOSIS — R45851 Suicidal ideations: Secondary | ICD-10-CM | POA: Diagnosis not present

## 2024-01-19 DIAGNOSIS — Z87828 Personal history of other (healed) physical injury and trauma: Secondary | ICD-10-CM

## 2024-01-19 DIAGNOSIS — Z811 Family history of alcohol abuse and dependence: Secondary | ICD-10-CM | POA: Diagnosis not present

## 2024-01-19 DIAGNOSIS — Z79899 Other long term (current) drug therapy: Secondary | ICD-10-CM | POA: Diagnosis not present

## 2024-01-19 DIAGNOSIS — F332 Major depressive disorder, recurrent severe without psychotic features: Principal | ICD-10-CM | POA: Diagnosis present

## 2024-01-19 DIAGNOSIS — F17290 Nicotine dependence, other tobacco product, uncomplicated: Secondary | ICD-10-CM | POA: Diagnosis not present

## 2024-01-19 DIAGNOSIS — Z91148 Patient's other noncompliance with medication regimen for other reason: Secondary | ICD-10-CM

## 2024-01-19 DIAGNOSIS — T43596A Underdosing of other antipsychotics and neuroleptics, initial encounter: Secondary | ICD-10-CM | POA: Diagnosis not present

## 2024-01-19 MED ORDER — DIPHENHYDRAMINE HCL 25 MG PO CAPS: 50.0000 mg | ORAL_CAPSULE | Freq: Three times a day (TID) | ORAL | Status: DC | PRN

## 2024-01-19 MED ORDER — ADULT MULTIVITAMIN W/MINERALS CH
1.0000 | ORAL_TABLET | Freq: Every day | ORAL | Status: DC
  Administered 2024-01-20 – 2024-02-02 (×14): 1 via ORAL
  Filled 2024-01-19 (×14): qty 1

## 2024-01-19 MED ORDER — HALOPERIDOL 5 MG PO TABS: 5.0000 mg | ORAL_TABLET | Freq: Three times a day (TID) | ORAL | Status: DC | PRN

## 2024-01-19 MED ORDER — VITAMIN B-1 100 MG PO TABS
100.0000 mg | ORAL_TABLET | Freq: Every day | ORAL | Status: DC
Start: 2024-01-20 — End: 2024-01-28
  Administered 2024-01-20 – 2024-01-28 (×9): 100 mg via ORAL
  Filled 2024-01-19 (×11): qty 1

## 2024-01-19 MED ORDER — ACAMPROSATE CALCIUM 333 MG PO TBEC
666.0000 mg | DELAYED_RELEASE_TABLET | Freq: Three times a day (TID) | ORAL | Status: DC
  Filled 2024-01-19 (×25): qty 2

## 2024-01-19 MED ORDER — ARIPIPRAZOLE 5 MG PO TABS
5.0000 mg | ORAL_TABLET | Freq: Every day | ORAL | Status: DC
Start: 2024-01-20 — End: 2024-02-02
  Filled 2024-01-19 (×10): qty 1

## 2024-01-19 MED ORDER — ALUM & MAG HYDROXIDE-SIMETH 200-200-20 MG/5ML PO SUSP: 30.0000 mL | Freq: Four times a day (QID) | ORAL | Status: DC | PRN

## 2024-01-19 MED ORDER — TRAZODONE HCL 50 MG PO TABS
50.0000 mg | ORAL_TABLET | Freq: Every evening | ORAL | Status: DC | PRN
Start: 2024-01-19 — End: 2024-02-02

## 2024-01-19 MED ORDER — MAGNESIUM HYDROXIDE 400 MG/5ML PO SUSP: 30.0000 mL | Freq: Every day | ORAL | Status: DC | PRN

## 2024-01-19 MED ORDER — NICOTINE 14 MG/24HR TD PT24
14.0000 mg | MEDICATED_PATCH | Freq: Every day | TRANSDERMAL | Status: DC
  Filled 2024-01-19 (×10): qty 1

## 2024-01-19 MED ORDER — ACETAMINOPHEN 325 MG PO TABS: 650.0000 mg | ORAL_TABLET | Freq: Four times a day (QID) | ORAL | Status: DC | PRN

## 2024-01-19 NOTE — Group Note (Signed)
 Recreation Therapy Group Note   Group Topic:Animal Assisted Therapy   Group Date: 01/19/2024 Start Time: 0945 End Time: 1030 Facilitators: Donavan Kerlin-McCall, LRT,CTRS Location: [address] Day[address]   Animal-Assisted Activity (AAA) Program Checklist/Progress Notes Patient Eligibility Criteria Checklist & Daily Group note for Rec Tx Intervention  AAA/T Program Assumption of Risk Form signed by Patient/ or Parent Legal Guardian Yes  Patient is free of allergies or severe asthma Yes  Patient reports no fear of animals Yes  Patient reports no history of cruelty to animals No  Patient understands his/her participation is voluntary Yes  Patient washes hands before animal contact Yes  Patient washes hands after animal contact Yes  Education: Hand Washing, Appropriate Animal Interaction   Education Outcome: Acknowledges education.    Affect/Mood: N/A   Participation Level: Did not attend    Clinical Observations/Individualized Feedback:    Plan: Continue to engage patient in RT group sessions 2-3x/week.   Senay Sistrunk-McCall, LRT,CTRS 01/19/2024 12:49 PM

## 2024-01-19 NOTE — Progress Notes (Addendum)
 Pt continues to minimize his situation and tries to act like he has no issues, but pt has been appropriate on the unit this evening    01/19/24 0015  Psych Admission Type (Psych Patients Only)  Admission Status Involuntary  Psychosocial Assessment  Patient Complaints None  Eye Contact Fair  Facial Expression Animated  Affect Appropriate to circumstance  Speech Logical/coherent  Interaction Assertive  Motor Activity Slow  Appearance/Hygiene Unremarkable  Behavior Characteristics Cooperative  Mood Pleasant  Aggressive Behavior  Effect No apparent injury  Thought Process  Coherency WDL  Content WDL  Delusions WDL  Perception WDL  Hallucination None reported or observed  Judgment Poor  Confusion WDL  Danger to Self  Current suicidal ideation? Denies  Danger to Others  Danger to Others None reported or observed

## 2024-01-19 NOTE — Plan of Care (Signed)
  Problem: Education: Goal: Knowledge of [facility] Education information/materials will improve Outcome: Progressing Goal: Emotional status will improve Outcome: Progressing Goal: Mental status will improve Outcome: Progressing Goal: Verbalization of understanding the information provided will improve Outcome: Progressing   Problem: Activity: Goal: Interest or engagement in activities will improve Outcome: Progressing Goal: Sleeping patterns will improve Outcome: Progressing   Problem: Coping: Goal: Ability to verbalize frustrations and anger appropriately will improve Outcome: Progressing Goal: Ability to demonstrate self-control will improve Outcome: Progressing   Problem: Health Behavior/Discharge Planning: Goal: Identification of resources available to assist in meeting health care needs will improve Outcome: Progressing Goal: Compliance with treatment plan for underlying cause of condition will improve Outcome: Progressing   Problem: Physical Regulation: Goal: Ability to maintain clinical measurements within normal limits will improve Outcome: Progressing   Problem: Safety: Goal: Periods of time without injury will increase Outcome: Progressing   Problem: Education: Goal: Knowledge of [facility] Education information/materials will improve Outcome: Progressing Goal: Emotional status will improve Outcome: Progressing Goal: Mental status will improve Outcome: Progressing Goal: Verbalization of understanding the information provided will improve Outcome: Progressing   Problem: Activity: Goal: Interest or engagement in activities will improve Outcome: Progressing Goal: Sleeping patterns will improve Outcome: Progressing   Problem: Coping: Goal: Ability to verbalize frustrations and anger appropriately will improve Outcome: Progressing Goal: Ability to demonstrate self-control will improve Outcome: Progressing   Problem: Health  Behavior/Discharge Planning: Goal: Identification of resources available to assist in meeting health care needs will improve Outcome: Progressing Goal: Compliance with treatment plan for underlying cause of condition will improve Outcome: Progressing   Problem: Physical Regulation: Goal: Ability to maintain clinical measurements within normal limits will improve Outcome: Progressing   Problem: Safety: Goal: Periods of time without injury will increase Outcome: Progressing

## 2024-01-19 NOTE — Plan of Care (Signed)

## 2024-01-19 NOTE — Group Note (Signed)
 LCSW Group Therapy Note   Group Date: 01/19/2024 Start Time: 1100 End Time: 1200   Participation:  patient was present and actively participated in the discussion.  He was insightful and knowledgeable.    Type of Therapy:  Group Therapy  Topic:  Stress Less:  Nurturing Your Mind and Body Through Calm    Objective:  Learn techniques for managing stress through body relaxation, mindfulness, and self-compassion. Goals: Use body relaxation techniques, such as Box Breathing and Progressive Muscle Relaxation, to reduce physical tension. Practice mindfulness to break the cycle of overthinking and mental chatter. Embrace self-compassion to handle stress with kindness and resilience.  Summary:  Today's session focused on calming the body with relaxation techniques, breaking the cycle of stress with mindfulness, and using self-compassion to manage challenges more gracefully. These tools help reduce stress and foster a balanced, peaceful mindset.  Therapeutic Modalities used:  Elements of CBT ( cognitive restructuring)  Elements of DBT (box breathing, progressive body relaxation, mindfulness, acceptance)    Luisenrique Conran O Dellas Guard, LCSWA 01/19/2024  12:12 PM

## 2024-01-19 NOTE — Plan of Care (Signed)
   Problem: Activity: Goal: Interest or engagement in activities will improve Outcome: Progressing Goal: Sleeping patterns will improve Outcome: Progressing

## 2024-01-19 NOTE — Progress Notes (Signed)
   01/19/24 2030  Psych Admission Type (Psych Patients Only)  Admission Status Involuntary  Psychosocial Assessment  Patient Complaints None  Eye Contact Fair  Facial Expression Animated  Affect Appropriate to circumstance  Speech Logical/coherent  Interaction Assertive  Motor Activity Slow  Appearance/Hygiene Unremarkable  Behavior Characteristics Cooperative  Mood Pleasant  Aggressive Behavior  Effect No apparent injury  Thought Process  Coherency WDL  Content WDL  Delusions WDL  Perception WDL  Hallucination None reported or observed  Judgment Poor  Confusion WDL  Danger to Self  Current suicidal ideation? Denies  Danger to Others  Danger to Others None reported or observed
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
## 2024-01-19 NOTE — Group Note (Signed)
 Date:  01/19/2024 Time:  8:33 AM  Group Topic/Focus:  Goals Group:   The focus of this group is to help patients establish daily goals to achieve during treatment and discuss how the patient can incorporate goal setting into their daily lives to aide in recovery.    Participation Level:  Active  Participation Quality:  Appropriate  Affect:  Appropriate   Kyle Webb 01/19/2024, 8:33 AM

## 2024-01-19 NOTE — Plan of Care (Signed)
   Problem: Education: Goal: Emotional status will improve Outcome: Progressing Goal: Mental status will improve Outcome: Progressing   Problem: Activity: Goal: Interest or engagement in activities will improve Outcome: Progressing Goal: Sleeping patterns will improve Outcome: Progressing

## 2024-01-19 NOTE — Progress Notes (Signed)
 Pt discharged at this time pending readmission.
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
## 2024-01-19 NOTE — BHH Suicide Risk Assessment (Signed)
 Suicide Risk Assessment  Discharge Assessment    [facility] Discharge Suicide Risk Assessment   Principal Problem: MDD (major depressive disorder), recurrent severe, without psychosis (HCC) Discharge Diagnoses: Principal Problem:   MDD (major depressive disorder), recurrent severe, without psychosis (HCC) Active Problems:   Alcohol  use disorder, severe, dependence (HCC)   Total Time spent with patient: 15 minutes  Musculoskeletal: Strength & Muscle Tone: within normal limits Gait & Station: normal Patient leans: N/A  Psychiatric Specialty Exam  Presentation  General Appearance:  Appropriate for Environment; Casual  Eye Contact: Good  Speech: Clear and Coherent; Normal Rate  Speech Volume: Normal  Handedness: Right   Mood and Affect  Mood: Euthymic  Duration of Depression Symptoms: Greater than two weeks  Affect: Inappropriate   Thought Process  Thought Processes: Coherent; Goal Directed  Descriptions of Associations:Intact  Orientation:Full (Time, Place and Person)  Thought Content:WDL  History of Schizophrenia/Schizoaffective disorder:No  Duration of Psychotic Symptoms:No data recorded Hallucinations:Hallucinations: None  Ideas of Reference:None  Suicidal Thoughts:Suicidal Thoughts: No  Homicidal Thoughts:Homicidal Thoughts: No   Sensorium  Memory: Immediate Fair; Recent Fair  Judgment: Poor  Insight: Lacking   Executive Functions  Concentration: Good  Attention Span: Good  Recall: Good  Fund of Knowledge: Good  Language: Good   Psychomotor Activity  Psychomotor Activity: Psychomotor Activity: Normal   Assets  Assets: Communication Skills; Physical Health; Resilience   Sleep  Sleep: Sleep: Good Number of Hours of Sleep: 8.75   Physical Exam: Physical Exam Vitals and nursing note reviewed.  Constitutional:      General: He is not in acute distress.    Appearance: Normal appearance. He is normal weight. He  is not ill-appearing or toxic-appearing.  HENT:     Head: Normocephalic and atraumatic.  Pulmonary:     Effort: Pulmonary effort is normal.  Musculoskeletal:        General: Normal range of motion.  Neurological:     General: No focal deficit present.     Mental Status: He is alert.    Review of Systems  Respiratory:  Negative for cough and shortness of breath.   Cardiovascular:  Negative for chest pain.  Gastrointestinal:  Negative for abdominal pain, constipation, diarrhea, nausea and vomiting.  Neurological:  Negative for dizziness, weakness and headaches.  Psychiatric/Behavioral:  Negative for depression, hallucinations and suicidal ideas. The patient is not nervous/anxious.    Blood pressure 109/60, pulse 63, temperature 98.3 F (36.8 C), temperature source Oral, resp. rate 14, height 5\' 7"  (1.702 m), weight 70.3 kg, SpO2 99%. Body mass index is 24.28 kg/m.  Mental Status Per Nursing Assessment::   On Admission:  Suicidal ideation indicated by others, Suicide plan  Demographic Factors:  Male, Adolescent or young adult, Caucasian, and Living alone  Loss Factors: NA  Historical Factors: Impulsivity  Risk Reduction Factors:   Employed  Continued Clinical Symptoms:  Alcohol /Substance Abuse/Dependencies More than one psychiatric diagnosis Unstable or Poor Therapeutic Relationship Previous Psychiatric Diagnoses and Treatments Medical Diagnoses and Treatments/Surgeries  Cognitive Features That Contribute To Risk:  Closed-mindedness, Loss of executive function, and Polarized thinking    Suicide Risk:  Severe:  Patient continues to be a high risk for suicide so while he is being discharged he is then being readmitted at the same time to the facility.   Follow-up Information     [facility]. Call.   Specialty: Behavioral Health Why: A referral has been made to this provider.  Please call to schedule an assessment  for the substance  abuse intensive outpatient therapy program. Contact information: [address] [place]  [address] [phone]        [facility] Follow up.   Why: You may call this provider if you wish to obtain therapy and/or medication management services. Contact information: [address] [place] [address] [phone]                 Plan Of Care/Follow-up recommendations:  Patient is being discharged and readmitted to facility.   Basilia Bosworth, MD 01/19/2024, 2:50 PM

## 2024-01-19 NOTE — BHH Group Notes (Signed)
 B[facility] Group Notes:  (Nursing/MHT/Case Management/Adjunct)  Date:  01/19/2024  Time: 2000  Type of Therapy:  Wrap up group  Participation Level:  Active  Participation Quality:  Appropriate, Attentive, Sharing, and Supportive  Affect:  Appropriate  Cognitive:  Alert  Insight:  Improving  Engagement in Group:  Engaged  Modes of Intervention:  Clarification, Education, and Socialization  Summary of Progress/Problems: Positive thinking and self-care were discussed.   Catharine Clock 01/19/2024, 9:51 PM

## 2024-01-20 DIAGNOSIS — F332 Major depressive disorder, recurrent severe without psychotic features: Secondary | ICD-10-CM | POA: Diagnosis not present

## 2024-01-20 NOTE — BHH Group Notes (Signed)
 Spirituality Group   Focus of discussion: Gratitude and Strength Awareness   Process: Following theoretical framework of group therapy of Irvin Yalom and further informed by Rogerian and Relational Cultural Theory approaches, participants invited to name:   Sources of gratitude (internal>external)   Articulate gratitude for self   Name a personal strength/gift/skill   Locate points of resonance among group members/engage the "here and now"   Conclude with grounding/breathwork  Observations: Madelyne Schiff was an active participant in the group discussion.  Merel Santoli [name] Aly, M.Div [phone]

## 2024-01-20 NOTE — Progress Notes (Signed)
   01/20/24 0900  Psych Admission Type (Psych Patients Only)  Admission Status Involuntary  Psychosocial Assessment  Patient Complaints None  Eye Contact Fair  Facial Expression Animated  Affect Appropriate to circumstance  Speech Logical/coherent  Interaction Assertive  Motor Activity Other (Comment) (WNL)  Appearance/Hygiene Unremarkable  Behavior Characteristics Cooperative  Mood Pleasant  Thought Process  Coherency WDL  Content WDL  Delusions None reported or observed  Perception WDL  Hallucination None reported or observed  Judgment Poor  Confusion None  Danger to Self  Current suicidal ideation? Denies  Danger to Others  Danger to Others None reported or observed
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
## 2024-01-20 NOTE — Group Note (Signed)
 Date:  01/20/2024 Time:  9:07 AM  Group Topic/Focus:  Goals Group:   The focus of this group is to help patients establish daily goals to achieve during treatment and discuss how the patient can incorporate goal setting into their daily lives to aide in recovery. Orientation:   The focus of this group is to educate the patient on the purpose and policies of crisis stabilization and provide a format to answer questions about their admission.  The group details unit policies and expectations of patients while admitted.    Participation Level:  Active  Participation Quality:  Appropriate  Affect:  Appropriate  Cognitive:  Appropriate  Insight: Appropriate  Engagement in Group:  Engaged  Modes of Intervention:  Discussion and Orientation  Additional Comments:    Huxley Vanwagoner D Tierrah Anastos 01/20/2024, 9:07 AM

## 2024-01-20 NOTE — Plan of Care (Signed)
   Problem: Education: Goal: Mental status will improve Outcome: Progressing Goal: Verbalization of understanding the information provided will improve Outcome: Progressing   Problem: Activity: Goal: Interest or engagement in activities will improve Outcome: Progressing Goal: Sleeping patterns will improve Outcome: Progressing

## 2024-01-20 NOTE — Progress Notes (Signed)
   01/20/24 2045  Psych Admission Type (Psych Patients Only)  Admission Status Involuntary  Psychosocial Assessment  Patient Complaints None  Eye Contact Fair  Facial Expression Animated  Affect Appropriate to circumstance  Speech Logical/coherent  Interaction Assertive  Motor Activity Slow  Appearance/Hygiene Unremarkable  Behavior Characteristics Cooperative  Mood Pleasant  Aggressive Behavior  Effect No apparent injury  Thought Process  Coherency WDL  Content WDL  Delusions WDL  Perception WDL  Hallucination None reported or observed  Judgment WDL  Confusion WDL  Danger to Self  Current suicidal ideation? Denies  Danger to Others  Danger to Others None reported or observed

## 2024-01-20 NOTE — [facility] Group Notes (Signed)
Patient attended the NA group. ?

## 2024-01-20 NOTE — Group Note (Signed)
 Date:  01/20/2024 Time:  1:26 PM  Group Topic/Focus:  Emotional Education:   The focus of this group is to discuss unhealthy thought patterns and how they impact mental health.     Participation Level:  Minimal  Participation Quality:  Appropriate  Affect:  Appropriate  Cognitive:  Appropriate  Insight: Appropriate  Engagement in Group:  Developing/Improving  Modes of Intervention:  Discussion and Education  Additional Comments:    Sheryl Donna 01/20/2024, 1:26 PM

## 2024-01-20 NOTE — Group Note (Signed)
 Recreation Therapy Group Note   Group Topic:Communication  Group Date: 01/20/2024 Start Time: 0933 End Time: 1013 Facilitators: Magdiel Bartles-McCall, LRT,CTRS Location: [address] Day[address]   Group Topic: Communication, Problem Solving   Goal Area(s) Addresses:  Patient will effectively listen to complete activity.  Patient will identify communication skills used to make activity successful.  Patient will identify how skills used during activity can be used to reach post d/c goals.    Behavioral Response: Engaged   Intervention: Building surveyor Activity - Geometric pattern cards, pencils, blank paper    Activity: Geometric Drawings.  Three volunteers from the peer group will be shown an abstract picture with a particular arrangement of geometrical shapes.  Each round, one 'speaker' will describe the pattern, as accurately as possible without revealing the image to the group.  The remaining group members will listen and draw the picture to reflect how it is described to them. Patients with the role of 'listener' cannot ask clarifying questions but, may request that the speaker repeat a direction. Once the drawings are complete, the presenter will show the rest of the group the picture and compare how close each person came to drawing the picture. LRT will facilitate a post-activity discussion regarding effective communication and the importance of planning, listening, and asking for clarification in daily interactions with others.  Education: Environmental consultant, Active listening, Support systems, Discharge planning  Education Outcome: Acknowledges understanding/In group clarification offered/Needs additional education.    Affect/Mood: Appropriate   Participation Level: Engaged   Participation Quality: Independent   Behavior: Appropriate   Speech/Thought Process: Focused   Insight: Good   Judgement: Good   Modes of Intervention: Activity   Patient Response to  Interventions:  Engaged   Education Outcome:  In group clarification offered    Clinical Observations/Individualized Feedback: Pt was bright and engaged. Pt was focused on being able to complete the pictures presented in group. Pt would also let his comedic side out at times but was able to refocus.    Plan: Continue to engage patient in RT group sessions 2-3x/week.   Lorenia Hoston-McCall, LRT,CTRS 01/20/2024 12:53 PM

## 2024-01-20 NOTE — Plan of Care (Signed)
   Problem: Education: Goal: Emotional status will improve Outcome: Progressing Goal: Mental status will improve Outcome: Progressing   Problem: Activity: Goal: Sleeping patterns will improve Outcome: Progressing

## 2024-01-21 DIAGNOSIS — F332 Major depressive disorder, recurrent severe without psychotic features: Secondary | ICD-10-CM | POA: Diagnosis not present

## 2024-01-21 NOTE — Plan of Care (Signed)
  Problem: Education: Goal: Emotional status will improve Outcome: Progressing Goal: Verbalization of understanding the information provided will improve Outcome: Progressing   Problem: Coping: Goal: Ability to demonstrate self-control will improve Outcome: Progressing   Problem: Physical Regulation: Goal: Ability to maintain clinical measurements within normal limits will improve Outcome: Progressing

## 2024-01-21 NOTE — Group Note (Signed)
 LCSW Group Therapy Note   Group Date: 01/21/2024 Start Time: 1100 End Time: 1200  Participation:  patient was present and actively participated in the conversation.  Type of Therapy:  Group Therapy  Topic:  Understanding your Path to Change   Objective:  The goal is to help individuals understand the stages of change, identify where they currently are in the process, and provide actionable next steps to continue moving forward in their journey of change.  Goals: - Learn about the six stages of change:  Precontemplation, Contemplation, Preparation, Action, Maintenance, and Relapse - Reflect on Current Change Efforts:  Recognize which stage participants are in regarding a personal change. -  Plan Next Steps for Moving Forward:  Create an action plan based on their current stage of change.  Class Summary:  In this session, we explored the Stages of Change as a framework to understand the process of change.  We discussed how each stage helps individuals recognize where they are in their personal journey and used the Stages of Change Worksheet for self-reflection. Participants answered questions to better understand their current stage, challenges, and progress. We also emphasized the importance of moving forward, even if setbacks (Relapse) occur, and created actionable steps to help participants continue progressing. By the end of the session, participants gained a clearer understanding of their path to change and left with a clear plan for next steps.  Modalities:  Elements of CBT (cognitive restructuring, problem solving)  Element of DBT (mindfulness, distress tolerance)   Kyle Webb Kyle Webb, LCSWA 01/21/2024  3:40 PM

## 2024-01-21 NOTE — [facility] Group Notes (Signed)

## 2024-01-21 NOTE — Plan of Care (Signed)
°  Problem: Education: °Goal: Emotional status will improve °Outcome: Progressing °  °Problem: Activity: °Goal: Sleeping patterns will improve °Outcome: Progressing °  °Problem: Safety: °Goal: Periods of time without injury will increase °Outcome: Progressing °  °

## 2024-01-21 NOTE — [facility] Group Notes (Signed)
 The focus of this group is to help patients establish daily goals to achieve during treatment and discuss how the patient can incorporate goal setting into their daily lives to aide in recovery.         Scale 1-10  10    Goal: Think before I act

## 2024-01-21 NOTE — Progress Notes (Signed)
   01/21/24 2100  Psych Admission Type (Psych Patients Only)  Admission Status Involuntary  Psychosocial Assessment  Patient Complaints None  Eye Contact Fair  Facial Expression Animated  Affect Appropriate to circumstance  Speech Logical/coherent  Interaction Assertive  Motor Activity Slow  Appearance/Hygiene Unremarkable  Behavior Characteristics Cooperative  Mood Pleasant  Aggressive Behavior  Effect No apparent injury  Thought Process  Coherency WDL  Content WDL  Delusions WDL  Perception WDL  Hallucination None reported or observed  Judgment WDL  Confusion WDL  Danger to Self  Current suicidal ideation? Denies  Danger to Others  Danger to Others None reported or observed

## 2024-01-21 NOTE — Progress Notes (Signed)
   01/21/24 0900  Psych Admission Type (Psych Patients Only)  Admission Status Involuntary  Psychosocial Assessment  Patient Complaints None  Eye Contact Fair  Facial Expression Animated  Affect Appropriate to circumstance  Speech Logical/coherent  Interaction Assertive  Motor Activity Slow  Appearance/Hygiene Unremarkable  Behavior Characteristics Cooperative  Mood Pleasant  Thought Process  Coherency WDL  Content WDL  Delusions None reported or observed  Perception WDL  Hallucination None reported or observed  Judgment WDL  Confusion None  Danger to Self  Current suicidal ideation? Denies  Agreement Not to Harm Self Yes  Description of Agreement verbal  Danger to Others  Danger to Others None reported or observed

## 2024-01-21 NOTE — Group Note (Signed)
 Occupational Therapy Group Note  Group Topic: Sleep Hygiene  Group Date: 01/21/2024 Start Time: 1400 End Time: 1430 Facilitators: Lynnda Sas, OT   Group Description: Group encouraged increased participation and engagement through topic focused on sleep hygiene. Patients reflected on the quality of sleep they typically receive and identified areas that need improvement. Group was given background information on sleep and sleep hygiene, including common sleep disorders. Group members also received information on how to improve one's sleep and introduced a sleep diary as a tool that can be utilized to track sleep quality over a length of time. Group session ended with patients identifying one or more strategies they could utilize or implement into their sleep routine in order to improve overall sleep quality.        Therapeutic Goal(s):  Identify one or more strategies to improve overall sleep hygiene  Identify one or more areas of sleep that are negatively impacted (sleep too much, too little, etc)     Participation Level: Engaged   Participation Quality: Independent   Behavior: Appropriate   Speech/Thought Process: Relevant   Affect/Mood: Appropriate   Insight: Fair   Judgement: Fair      Modes of Intervention: Education  Patient Response to Interventions:  Attentive   Plan: Continue to engage patient in OT groups 2 - 3x/week.  01/21/2024  Lynnda Sas, OT   Kyle Webb, OT
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
## 2024-01-22 ENCOUNTER — Encounter (HOSPITAL_COMMUNITY): Payer: Self-pay

## 2024-01-22 DIAGNOSIS — F332 Major depressive disorder, recurrent severe without psychotic features: Secondary | ICD-10-CM | POA: Diagnosis not present
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
## 2024-01-22 NOTE — Plan of Care (Signed)
   Problem: Education: Goal: Emotional status will improve Outcome: Progressing Goal: Mental status will improve Outcome: Progressing   Problem: Activity: Goal: Interest or engagement in activities will improve Outcome: Progressing Goal: Sleeping patterns will improve Outcome: Progressing

## 2024-01-22 NOTE — Progress Notes (Signed)
 Adult Psychoeducational Group Note  Date:  01/22/2024 Time:  8:46 PM  Group Topic/Focus:  Wrap-Up Group:   The focus of this group is to help patients review their daily goal of treatment and discuss progress on daily workbooks.  Participation Level:  Active  Participation Quality:  Appropriate  Affect:  Appropriate  Cognitive:  Appropriate  Insight: Appropriate  Engagement in Group:  Engaged  Modes of Intervention:  Discussion  Additional Comments:  Attend wrap up group AA.  Kyle Webb 01/22/2024, 8:46 PM

## 2024-01-22 NOTE — Progress Notes (Signed)
   01/22/24 0800  Psych Admission Type (Psych Patients Only)  Admission Status Involuntary  Psychosocial Assessment  Patient Complaints None  Eye Contact Fair  Facial Expression Animated  Affect Appropriate to circumstance  Speech Logical/coherent  Interaction Assertive  Motor Activity Slow  Appearance/Hygiene Unremarkable  Behavior Characteristics Cooperative;Appropriate to situation  Mood Pleasant  Aggressive Behavior  Effect No apparent injury  Thought Process  Coherency WDL  Content WDL  Delusions WDL  Perception WDL  Hallucination None reported or observed  Judgment WDL  Confusion WDL  Danger to Self  Current suicidal ideation? Denies  Self-Injurious Behavior No self-injurious ideation or behavior indicators observed or expressed   Agreement Not to Harm Self Yes  Description of Agreement Verbal  Danger to Others  Danger to Others None reported or observed

## 2024-01-22 NOTE — BH IP Treatment Plan (Signed)
 Interdisciplinary Treatment and Diagnostic Plan Update  01/22/2024 Time of Session: 12:20 PM - UPDATE Kyle Webb MRN: [id number]  Principal Diagnosis: MDD (major depressive disorder), recurrent severe, without psychosis (HCC)  Secondary Diagnoses: Principal Problem:   MDD (major depressive disorder), recurrent severe, without psychosis (HCC) Active Problems:   Alcohol  use disorder, severe, dependence (HCC)   Current Medications:  Current Facility-Administered Medications  Medication Dose Route Frequency Provider Last Rate Last Admin   acamprosate (CAMPRAL) tablet 666 mg  666 mg Oral TID Pashayan, Alexander S, MD       acetaminophen  (TYLENOL ) tablet 650 mg  650 mg Oral Q6H PRN Pashayan, Alexander S, MD       alum & mag hydroxide-simeth (MAALOX/MYLANTA) 200-200-20 MG/5ML suspension 30 mL  30 mL Oral Q6H PRN Jann Melody, Knute Perla, MD       ARIPiprazole  (ABILIFY ) tablet 5 mg  5 mg Oral Daily Pashayan, Knute Perla, MD       haloperidol  (HALDOL ) tablet 5 mg  5 mg Oral TID PRN Basilia Bosworth, MD       And   diphenhydrAMINE  (BENADRYL ) capsule 50 mg  50 mg Oral TID PRN Basilia Bosworth, MD       magnesium  hydroxide (MILK OF MAGNESIA) suspension 30 mL  30 mL Oral Daily PRN Pashayan, Alexander S, MD       multivitamin with minerals tablet 1 tablet  1 tablet Oral Daily Pashayan, Knute Perla, MD   1 tablet at 01/22/24 0800   nicotine  (NICODERM CQ  - dosed in mg/24 hours) patch 14 mg  14 mg Transdermal Daily Pashayan, Alexander S, MD       thiamine  (Vitamin B-1) tablet 100 mg  100 mg Oral Daily Pashayan, Alexander S, MD   100 mg at 01/22/24 0800   traZODone  (DESYREL ) tablet 50 mg  50 mg Oral QHS PRN Pashayan, Alexander S, MD       PTA Medications: No medications prior to admission.    Patient Stressors:    Patient Strengths:    Treatment Modalities: Medication Management, Group therapy, Case management,  1 to 1 session with clinician, Psychoeducation, Recreational  therapy.   Physician Treatment Plan for Primary Diagnosis: MDD (major depressive disorder), recurrent severe, without psychosis (HCC) Long Term Goal(s): Improvement in symptoms so as ready for discharge   Short Term Goals: Ability to identify changes in lifestyle to reduce recurrence of condition will improve Ability to verbalize feelings will improve Ability to disclose and discuss suicidal ideas Ability to demonstrate self-control will improve Ability to identify and develop effective coping behaviors will improve Ability to maintain clinical measurements within normal limits will improve Compliance with prescribed medications will improve Ability to identify triggers associated with substance abuse/mental health issues will improve  Medication Management: Evaluate patient's response, side effects, and tolerance of medication regimen.  Therapeutic Interventions: 1 to 1 sessions, Unit Group sessions and Medication administration.  Evaluation of Outcomes: Progressing  Physician Treatment Plan for Secondary Diagnosis: Principal Problem:   MDD (major depressive disorder), recurrent severe, without psychosis (HCC) Active Problems:   Alcohol  use disorder, severe, dependence (HCC)  Long Term Goal(s): Improvement in symptoms so as ready for discharge   Short Term Goals: Ability to identify changes in lifestyle to reduce recurrence of condition will improve Ability to verbalize feelings will improve Ability to disclose and discuss suicidal ideas Ability to demonstrate self-control will improve Ability to identify and develop effective coping behaviors will improve Ability to maintain clinical measurements within normal limits will  improve Compliance with prescribed medications will improve Ability to identify triggers associated with substance abuse/mental health issues will improve     Medication Management: Evaluate patient's response, side effects, and tolerance of medication  regimen.  Therapeutic Interventions: 1 to 1 sessions, Unit Group sessions and Medication administration.  Evaluation of Outcomes: Progressing   RN Treatment Plan for Primary Diagnosis: MDD (major depressive disorder), recurrent severe, without psychosis (HCC) Long Term Goal(s): Knowledge of disease and therapeutic regimen to maintain health will improve  Short Term Goals: Ability to remain free from injury will improve, Ability to verbalize frustration and anger appropriately will improve, Ability to verbalize feelings will improve, and Ability to disclose and discuss suicidal ideas  Medication Management: RN will administer medications as ordered by provider, will assess and evaluate patient's response and provide education to patient for prescribed medication. RN will report any adverse and/or side effects to prescribing provider.  Therapeutic Interventions: 1 on 1 counseling sessions, Psychoeducation, Medication administration, Evaluate responses to treatment, Monitor vital signs and CBGs as ordered, Perform/monitor CIWA, COWS, AIMS and Fall Risk screenings as ordered, Perform wound care treatments as ordered.  Evaluation of Outcomes: Progressing   LCSW Treatment Plan for Primary Diagnosis: MDD (major depressive disorder), recurrent severe, without psychosis (HCC) Long Term Goal(s): Safe transition to appropriate next level of care at discharge, Engage patient in therapeutic group addressing interpersonal concerns.  Short Term Goals: Engage patient in aftercare planning with referrals and resources, Increase ability to appropriately verbalize feelings, Facilitate acceptance of mental health diagnosis and concerns, and Identify triggers associated with mental health/substance abuse issues  Therapeutic Interventions: Assess for all discharge needs, 1 to 1 time with Social worker, Explore available resources and support systems, Assess for adequacy in community support network, Educate family  and significant other(s) on suicide prevention, Complete Psychosocial Assessment, Interpersonal group therapy.  Evaluation of Outcomes: Progressing   Progress in Treatment: Attending groups: Yes. Participating in groups: Yes. Taking medication as prescribed: Yes (some). Toleration medication: Yes. Family/Significant other contact made:  Yes, contacted father, Kyle Webb [phone] Patient understands diagnosis: No. Discussing patient identified problems/goals with staff: Yes. Medical problems stabilized or resolved: Yes. Denies suicidal/homicidal ideation: Yes. Issues/concerns per patient self-inventory: No.   New problem(s) identified:  No   New Short Term/Long Term Goal(s):  medication stabilization, elimination of SI thoughts, development of comprehensive mental wellness plan.    Patient Goals:  Patient recently admitted. CSW will continue to follow and assess for appropriate referrals and possible discharge planning.      Discharge Plan or Barriers:  Patient recently admitted. CSW will continue to follow and assess for appropriate referrals and possible discharge planning.    Reason for Continuation of Hospitalization: Depression Medication stabilization Suicidal ideation   Estimated Length of Stay:  4 - 6 days  Last 3 [place] Suicide Severity Risk Score: Flowsheet Row [facility] (Discharged) [facility]05/2024 in [facility]  C-SSRS RISK CATEGORY High Risk High Risk High Risk       Last PHQ 2/9 Scores:    05/21/2022    8:10 AM 03/03/2016    4:48 PM 03/15/2015   12:17 AM  Depression screen PHQ 2/9  Decreased Interest 0 0 0  Down, Depressed, Hopeless 0 0 0  PHQ - 2 Score 0 0 0  Altered sleeping  0 0  Tired, decreased energy  0 0  Change in appetite  1 0  Feeling bad or failure  about yourself   0 0  Trouble concentrating  0 0  Moving slowly or fidgety/restless  0 0  Suicidal thoughts  0 0  PHQ-9 Score  1 0    Scribe for Treatment Team: Liane Tribbey O Dudley Cooley, LCSWA 01/22/2024 3:44 PM

## 2024-01-22 NOTE — Plan of Care (Signed)
  Problem: Activity: Goal: Interest or engagement in activities will improve Outcome: Progressing   Problem: Coping: Goal: Ability to verbalize frustrations and anger appropriately will improve Outcome: Progressing   Problem: Physical Regulation: Goal: Ability to maintain clinical measurements within normal limits will improve Outcome: Progressing   

## 2024-01-22 NOTE — Progress Notes (Signed)
   01/22/24 2115  Psych Admission Type (Psych Patients Only)  Admission Status Involuntary  Psychosocial Assessment  Patient Complaints None  Eye Contact Fair  Facial Expression Animated  Affect Appropriate to circumstance  Speech Logical/coherent  Interaction Assertive  Motor Activity Slow  Appearance/Hygiene Unremarkable  Behavior Characteristics Cooperative  Mood Pleasant  Aggressive Behavior  Effect No apparent injury  Thought Process  Coherency WDL  Content WDL  Delusions WDL  Perception WDL  Hallucination None reported or observed  Judgment WDL  Confusion WDL  Danger to Self  Current suicidal ideation? Denies  Danger to Others  Danger to Others None reported or observed

## 2024-01-22 NOTE — [facility] Group Notes (Signed)
 The focus of this group is to help patients establish daily goals to achieve during treatment and discuss how the patient can incorporate goal setting into their daily lives to aide in recovery.     Scale 1-10 8     Goal: Work on cleaning room

## 2024-01-22 NOTE — Group Note (Signed)
 Date:  01/22/2024 Time:  2:16 AM  Group Topic/Focus:  Wrap-Up Group:   The focus of this group is to help patients review their daily goal of treatment and discuss progress on daily workbooks.    Participation Level:  Active  Participation Quality:  Appropriate and Sharing  Affect:  Appropriate  Cognitive:  Appropriate  Insight: Appropriate  Engagement in Group:  Engaged  Modes of Intervention:  Activity and Socialization  Additional Comments:  Patient attended and participated wrap up group. Patient shared about their day and participated in activity.   Dillard Frame 01/22/2024, 2:16 AM

## 2024-01-22 NOTE — Group Note (Signed)
 Recreation Therapy Group Note   Group Topic:Problem Solving  Group Date: 01/22/2024 Start Time: 0935 End Time: 0958 Facilitators: Anton Cheramie-McCall, LRT,CTRS Location: [address] Day[address]   Group Topic: Communication, Team Building, Problem Solving  Goal Area(s) Addresses:  Patient will effectively work with peer towards shared goal.  Patient will identify skills used to make activity successful.  Patient will identify how skills used during activity can be used to reach post d/c goals.   Intervention: STEM Activity  Activity: Stage manager. In teams of 3-5, patients were given 12 plastic drinking straws and an equal length of masking tape. Using the materials provided, patients were asked to build a landing pad to catch a golf ball dropped from approximately 5 feet in the air. All materials were required to be used by the team in their design. LRT facilitated post-activity discussion.  Education: Pharmacist, community, Scientist, physiological, Discharge Planning   Education Outcome: Acknowledges education/In group clarification offered/Needs additional education.    Affect/Mood: Appropriate   Participation Level: Engaged   Participation Quality: Independent   Behavior: Appropriate   Speech/Thought Process: Focused   Insight: Good   Judgement: Good   Modes of Intervention: STEM Activity   Patient Response to Interventions:  Engaged   Education Outcome:  In group clarification offered    Clinical Observations/Individualized Feedback: Pt was bright and engaged during group session. Pt was the lead at points during the activity. Pt interacted well with peers and helped create a landing pad that was successful.     Plan: Continue to engage patient in RT group sessions 2-3x/week.   Cameryn Chrisley-McCall, LRT,CTRS  01/22/2024 12:36 PM

## 2024-01-23 DIAGNOSIS — F332 Major depressive disorder, recurrent severe without psychotic features: Secondary | ICD-10-CM | POA: Diagnosis not present

## 2024-01-23 NOTE — Plan of Care (Signed)
  Problem: Education: Goal: Emotional status will improve Outcome: Progressing Goal: Verbalization of understanding the information provided will improve Outcome: Progressing   Problem: Activity: Goal: Sleeping patterns will improve Outcome: Progressing   Problem: Coping: Goal: Ability to demonstrate self-control will improve Outcome: Progressing   Problem: Health Behavior/Discharge Planning: Goal: Compliance with treatment plan for underlying cause of condition will improve Outcome: Progressing

## 2024-01-23 NOTE — Plan of Care (Signed)
   Problem: Education: Goal: Knowledge of [facility] Education information/materials will improve Outcome: Progressing Goal: Emotional status will improve Outcome: Progressing Goal: Mental status will improve Outcome: Progressing

## 2024-01-23 NOTE — Group Note (Signed)
 Date:  01/23/2024 Time:  10:44 AM  Group Topic/Focus:  Goals Group:   The focus of this group is to help patients establish daily goals to achieve during treatment and discuss how the patient can incorporate goal setting into their daily lives to aide in recovery. Orientation:   The focus of this group is to educate the patient on the purpose and policies of crisis stabilization and provide a format to answer questions about their admission.  The group details unit policies and expectations of patients while admitted.    Participation Level:  Active  Tanara Turvey J Kao Conry 01/23/2024, 10:44 AM

## 2024-01-23 NOTE — Progress Notes (Signed)
   01/23/24 0900  Psych Admission Type (Psych Patients Only)  Admission Status Involuntary  Psychosocial Assessment  Patient Complaints None  Eye Contact Fair  Facial Expression Animated  Affect Appropriate to circumstance  Speech Logical/coherent  Interaction Assertive  Motor Activity Slow  Appearance/Hygiene Unremarkable  Behavior Characteristics Cooperative  Mood Pleasant  Thought Process  Coherency WDL  Content WDL  Delusions None reported or observed  Perception WDL  Hallucination None reported or observed  Judgment WDL  Confusion WDL  Danger to Self  Current suicidal ideation? Denies  Agreement Not to Harm Self Yes  Description of Agreement verbal  Danger to Others  Danger to Others None reported or observed

## 2024-01-23 NOTE — Group Note (Signed)
 Date:  01/23/2024 Time:  10:57 AM  Group Topic/Focus:  Emotional Education:   The focus of this group is to discuss what feelings/emotions are, and how they are experienced.    Participation Level:  Active  Kyle Webb J Shaneece Stockburger 01/23/2024, 10:57 AM

## 2024-01-23 NOTE — Group Note (Signed)
 LCSW Group Therapy Note   Type of Therapy and Topic:  Group Therapy - Safety  Participation Level:  Active   Description of Group This process group involved patients discussing the situations or people in their lives that frequently make them safe or unsafe.  Anxiety was a common factor among all group participants and many of them described home situations that keep them on edge and not able to feel completely safe.  Three questions were addressed during the group:  (1) What makes you feel safe (or unsafe)?  (2) Do you feel safe with yourself and why?  (3) If you don't feel safe, what can you do?  A lengthy discussion ensued in which group members empathized with each other, gave suggestions to one another, and expressed their feelings freely.  Therapeutic Goals Patient will describe what makes them feel safe or unsafe in their everyday lives. Patient will think about and discuss whether they feel safe with themselves and what reasons might contribute to feeling safe or unsafe. Patients will participate in planning for what can be done to help themselves feel safer.   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 The patient share "I always feel safe everywhere I go. I don't have any form of anxiety, if there is somebody I find unsafe, I simply walk the other way. I like my study room with all electronics. I love going to the pool and playing guitarfor people. Just staying around good people and nothing goes wrong for me."   Therapeutic Modalities Cognitive Behavioral Therapy  Zerah Hilyer O Tali Cleaves, LCSWA 01/23/2024  3:54 PM
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
## 2024-01-23 NOTE — [facility] Group Notes (Signed)
 [facility] Group Notes:  (Nursing/MHT/Case Management/Adjunct)  Date:  01/23/2024  Time:  2000  Type of Therapy:  Wrap up group  Participation Level:  Active  Participation Quality:  Appropriate, Attentive, Sharing, and Supportive  Affect:  Appropriate  Cognitive:  Alert  Insight:  Improving  Engagement in Group:  Engaged  Modes of Intervention:  Clarification, Education, and Support  Summary of Progress/Problems: Positive thinking and positive change were discussed.   Catharine Clock 01/23/2024, 9:58 PM

## 2024-01-24 DIAGNOSIS — F332 Major depressive disorder, recurrent severe without psychotic features: Secondary | ICD-10-CM | POA: Diagnosis not present

## 2024-01-24 NOTE — Progress Notes (Signed)
   01/24/24 0800  Psych Admission Type (Psych Patients Only)  Admission Status Involuntary  Psychosocial Assessment  Patient Complaints None  Eye Contact Fair  Facial Expression Animated  Affect Apprehensive  Speech Logical/coherent  Interaction Assertive  Motor Activity Slow  Appearance/Hygiene Unremarkable  Behavior Characteristics Cooperative;Appropriate to situation  Mood Pleasant  Thought Process  Coherency WDL  Content WDL  Delusions None reported or observed  Perception WDL  Hallucination None reported or observed  Judgment WDL  Confusion None  Danger to Self  Current suicidal ideation? Denies  Self-Injurious Behavior No self-injurious ideation or behavior indicators observed or expressed   Agreement Not to Harm Self Yes  Description of Agreement Verbal  Danger to Others  Danger to Others None reported or observed

## 2024-01-24 NOTE — Plan of Care (Signed)
  Problem: Activity: Goal: Interest or engagement in activities will improve Outcome: Progressing   Problem: Coping: Goal: Ability to verbalize frustrations and anger appropriately will improve Outcome: Progressing   Problem: Physical Regulation: Goal: Ability to maintain clinical measurements within normal limits will improve Outcome: Progressing   

## 2024-01-24 NOTE — Group Note (Signed)
 Date:  01/24/2024 Time:  9:26 AM  Group Topic/Focus:  Goals Group:   The focus of this group is to help patients establish daily goals to achieve during treatment and discuss how the patient can incorporate goal setting into their daily lives to aide in recovery. Orientation:   The focus of this group is to educate the patient on the purpose and policies of crisis stabilization and provide a format to answer questions about their admission.  The group details unit policies and expectations of patients while admitted.    Participation Level:  Active  Participation Quality:  Appropriate  Affect:  Appropriate  Cognitive:  Appropriate  Insight: Appropriate  Engagement in Group:  Engaged  Modes of Intervention:  Orientation  Additional Comments:  Goal is to not be impulsive   Violette Grief 01/24/2024, 9:26 AM

## 2024-01-24 NOTE — Group Note (Signed)
 Date:  01/24/2024 Time:  9:31 PM  Group Topic/Focus:  Wrap-Up Group:   The focus of this group is to help patients review their daily goal of treatment and discuss progress on daily workbooks.    Participation Level:  Active  Participation Quality:  Appropriate and Attentive  Affect:  Appropriate  Cognitive:  Appropriate  Insight: Good  Engagement in Group:  Engaged  Modes of Intervention:  Discussion  Additional Comments:  Patient stated he had a good day day was 9 out of a [address]   Kyle Webb 01/24/2024, 9:31 PM
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
## 2024-01-24 NOTE — Plan of Care (Signed)
   Problem: Education: Goal: Emotional status will improve Outcome: Progressing Goal: Mental status will improve Outcome: Progressing   Problem: Activity: Goal: Interest or engagement in activities will improve Outcome: Progressing Goal: Sleeping patterns will improve Outcome: Progressing

## 2024-01-24 NOTE — Progress Notes (Signed)
   01/24/24 2015  Psych Admission Type (Psych Patients Only)  Admission Status Involuntary  Psychosocial Assessment  Patient Complaints None  Eye Contact Fair  Facial Expression Animated  Affect Anxious  Speech Logical/coherent  Interaction Assertive  Motor Activity Slow  Appearance/Hygiene Unremarkable  Behavior Characteristics Cooperative  Mood Pleasant  Aggressive Behavior  Effect No apparent injury  Thought Process  Coherency WDL  Content WDL  Delusions WDL  Perception WDL  Hallucination None reported or observed  Judgment WDL  Confusion WDL  Danger to Self  Current suicidal ideation? Denies  Danger to Others  Danger to Others None reported or observed

## 2024-01-25 NOTE — Group Note (Signed)
 Therapy Group Note  Group Topic:Other  Group Date: 01/25/2024 Start Time: 1430 End Time: 1509 Facilitators: Lynnda Sas, OT    The primary objective of this topic is to explore and understand the concept of occupational balance in the context of daily living. The term "occupational balance" is defined broadly, encompassing all activities that occupy an individual's time and energy, including self-care, leisure, and work-related tasks. The goal is to guide participants towards achieving a harmonious blend of these activities, tailored to their personal values and life circumstances. This balance is aimed at enhancing overall well-being, not by equally distributing time across activities, but by ensuring that daily engagements are fulfilling and not draining. The content delves into identifying various barriers that individuals face in achieving occupational balance, such as overcommitment, misaligned priorities, external pressures, and lack of effective time management. The impact of these barriers on occupational performance, roles, and lifestyles is examined, highlighting issues like reduced efficiency, strained relationships, and potential health problems. Strategies for cultivating occupational balance are a key focus. These strategies include practical methods like time blocking, prioritizing tasks, establishing self-care rituals, decluttering, connecting with nature, and engaging in reflective practices. These approaches are designed to be adaptable and applicable to a wide range of life scenarios, promoting a proactive and mindful approach to daily living. The overall aim is to equip participants with the knowledge and tools to create a balanced lifestyle that supports their mental, emotional, and physical health, thereby improving their functional performance in daily life.     Participation Level: Engaged   Participation Quality: Independent   Behavior: Appropriate   Speech/Thought  Process: Relevant   Affect/Mood: Appropriate   Insight: Fair   Judgement: Fair      Modes of Intervention: Education  Patient Response to Interventions:  Attentive   Plan: Continue to engage patient in OT groups 2 - 3x/week.  01/25/2024  Lynnda Sas, OT  Bradden Tadros, OT
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
## 2024-01-25 NOTE — Progress Notes (Signed)
 Adult Psychoeducational Group Note  Date:  01/25/2024 Time:  8:50 PM  Group Topic/Focus:  Wrap-Up Group:   The focus of this group is to help patients review their daily goal of treatment and discuss progress on daily workbooks.  Participation Level:  Active  Participation Quality:  Appropriate  Affect:  Appropriate  Cognitive:  Appropriate  Insight: Appropriate  Engagement in Group:  Engaged  Modes of Intervention:  Discussion  Additional Comments:  attend wrap up AA group.  Kyle Webb 01/25/2024, 8:50 PM

## 2024-01-25 NOTE — [facility] Group Notes (Signed)
 Adult Psychoeducational Group Note  Date:  01/25/2024 Time:  10:54 AM  Group Topic/Focus:  Goals Group:   The focus of this group is to help patients establish daily goals to achieve during treatment and discuss how the patient can incorporate goal setting into their daily lives to aide in recovery.  Participation Level:  Active  Participation Quality:  Appropriate  Affect:  Appropriate  Cognitive:  Appropriate  Insight: Appropriate  Engagement in Group:  Engaged  Modes of Intervention:  Exploration  Additional Comments:  Pt participated in goals group. Pt stated their goal is to think before he acts, control impulses. Pt identified no SI/HI  Alean Amen 01/25/2024, 10:54 AM

## 2024-01-25 NOTE — Progress Notes (Signed)
   01/25/24 0800  Psych Admission Type (Psych Patients Only)  Admission Status Involuntary  Psychosocial Assessment  Patient Complaints None  Eye Contact Fair  Facial Expression Animated  Affect Anxious  Speech Logical/coherent  Interaction Assertive  Motor Activity Other (Comment) (WDL)  Appearance/Hygiene Unremarkable  Behavior Characteristics Cooperative;Appropriate to situation  Mood Pleasant  Aggressive Behavior  Effect No apparent injury  Thought Process  Coherency WDL  Content WDL  Delusions WDL  Perception WDL  Hallucination None reported or observed  Judgment WDL  Confusion WDL  Danger to Self  Current suicidal ideation? Denies  Agreement Not to Harm Self Yes  Description of Agreement Verbal  Danger to Others  Danger to Others None reported or observed
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
## 2024-01-25 NOTE — Plan of Care (Signed)
  Problem: Coping: Goal: Ability to verbalize frustrations and anger appropriately will improve Outcome: Progressing   Problem: Activity: Goal: Interest or engagement in activities will improve Outcome: Progressing   Problem: Safety: Goal: Periods of time without injury will increase Outcome: Progressing

## 2024-01-26 NOTE — Group Note (Signed)
 LCSW Group Therapy Note   Group Date: 01/26/2024 Start Time: 1100 End Time: 1200   Participation:  patient was present and actively participated in the discussion.  Type of Therapy:  Group Therapy  Topic:  "Healing Flames: Navigating Anger with Compassion"  Objective:  Foster self-awareness and promote compassion toward oneself and others when dealing with anger.  Goals:  Help participants understand the underlying emotions and needs fueling anger. Provide coping strategies for healthier emotional expression and anger management.  Summary: This session explored anger as a volcano--an explosion driven by deeper feelings and unmet needs. Participants learned to identify anger triggers and underlying emotions, then practiced coping strategies like deep breathing, physical activity, and journaling. The group discussed healthy ways to manage anger before it escalates, using both personal reflection and shared experiences.  Therapeutic Modalities: Cognitive Behavioral Therapy (CBT): Challenging thoughts that fuel anger. Mindfulness: Increasing awareness of emotions and sensations.   Tilda Samudio O Chaquetta Schlottman, LCSWA 01/26/2024  12:50 PM

## 2024-01-26 NOTE — Progress Notes (Signed)
   01/26/24 0015  Psych Admission Type (Psych Patients Only)  Admission Status Involuntary  Psychosocial Assessment  Patient Complaints None  Eye Contact Fair  Facial Expression Animated  Affect Anxious  Speech Logical/coherent  Interaction Assertive  Motor Activity Slow  Appearance/Hygiene Unremarkable  Behavior Characteristics Cooperative  Mood Pleasant  Aggressive Behavior  Effect No apparent injury  Thought Process  Coherency WDL  Content WDL  Delusions WDL  Perception WDL  Hallucination None reported or observed  Judgment WDL  Confusion WDL  Danger to Self  Current suicidal ideation? Denies  Danger to Others  Danger to Others None reported or observed

## 2024-01-26 NOTE — Plan of Care (Signed)
   Problem: Education: Goal: Emotional status will improve Outcome: Progressing Goal: Mental status will improve Outcome: Progressing   Problem: Activity: Goal: Interest or engagement in activities will improve Outcome: Progressing Goal: Sleeping patterns will improve Outcome: Progressing

## 2024-01-26 NOTE — Progress Notes (Signed)
   01/26/24 0805  Psych Admission Type (Psych Patients Only)  Admission Status Involuntary  Psychosocial Assessment  Patient Complaints None  Eye Contact Brief;Avertive  Facial Expression Masked  Affect Preoccupied  Speech Logical/coherent  Interaction Assertive  Motor Activity Slow  Appearance/Hygiene Unremarkable  Behavior Characteristics Appropriate to situation;Cooperative  Mood Pleasant  Aggressive Behavior  Targets Self  Effect No apparent injury  Thought Process  Coherency WDL  Content WDL  Delusions WDL

## 2024-01-26 NOTE — Group Note (Deleted)
 Date:  01/26/2024 Time:  9:11 PM  Group Topic/Focus:  Wrap-Up Group:   The focus of this group is to help patients review their daily goal of treatment and discuss progress on daily workbooks.     Participation Level:  {BHH PARTICIPATION ZOXWR:60454}  Participation Quality:  {BHH PARTICIPATION QUALITY:22265}  Affect:  {BHH AFFECT:22266}  Cognitive:  {BHH COGNITIVE:22267}  Insight: {BHH Insight2:20797}  Engagement in Group:  {BHH ENGAGEMENT IN UJWJX:91478}  Modes of Intervention:  {BHH MODES OF INTERVENTION:22269}  Additional Comments:  ***  Tailey Top A Courtlynn Holloman 01/26/2024, 9:11 PM

## 2024-01-26 NOTE — Group Note (Signed)
 Date:  01/26/2024 Time:  10:36 AM  Group Topic/Focus:  Goals Group:   The focus of this group is to help patients establish daily goals to achieve during treatment and discuss how the patient can incorporate goal setting into their daily lives to aide in recovery.    Participation Level:  Did Not Attend  Participation Quality:  Did Not Attend  Affect:  Did Not Attend  Cognitive:  Did Not Attend  Insight: None  Engagement in Group:  Did Not Attend  Modes of Intervention:  Did Not Attend  Additional Comments:  Did Not Attend  Kyle Webb 01/26/2024, 10:36 AM

## 2024-01-26 NOTE — B[facility] Group Notes (Signed)
 Spiritual care group on grief and loss facilitated by Chaplain Nick Barman, [facility]  Group Goal: Support / Education around grief and loss  Members engage in facilitated group support and psycho-social education.  Group Description:  Following introductions and group rules, group members engaged in facilitated group dialogue and support around topic of loss, with particular support around experiences of loss in their lives. Group Identified types of loss (relationships / self / things) and identified patterns, circumstances, and changes that precipitate losses. Reflected on thoughts / feelings around loss, normalized grief responses, and recognized variety in grief experience. Group encouraged individual reflection on safe space and on the coping skills that they are already utilizing.  Group drew on Adlerian / Rogerian and narrative framework  Patient Progress: Kyle Webb attended group and actively engaged and participated in group conversation and activities.
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
## 2024-01-26 NOTE — Plan of Care (Signed)
  Problem: Activity: Goal: Interest or engagement in activities will improve Outcome: Progressing   Problem: Coping: Goal: Ability to verbalize frustrations and anger appropriately will improve Outcome: Progressing   Problem: Physical Regulation: Goal: Ability to maintain clinical measurements within normal limits will improve Outcome: Progressing   

## 2024-01-26 NOTE — Progress Notes (Signed)
  Shahzain Q Mcphie   Type of Note: IVC Court Hearing  Patient attended IVC court hearing and the judge granted [facility] another 7 days in order to discharge patient to CD IOP appointment on 5/27. Pt in agreement with continued hospitalization.  MD aware.  Signed:  Verbon Giangregorio, LCSW-A 01/26/2024  2:16 PM

## 2024-01-26 NOTE — Group Note (Signed)
 Date:  01/26/2024 Time:  9:12 PM  Group Topic/Focus:  Wrap-Up Group:   The focus of this group is to help patients review their daily goal of treatment and discuss progress on daily workbooks.    Participation Level:  Minimal  Participation Quality:  Appropriate and Attentive  Affect:  Appropriate and Flat  Cognitive:  Alert and Appropriate  Insight: Lacking  Engagement in Group:  Engaged  Modes of Intervention:  Discussion and Education  Additional Comments:  Pt attended and participated in wrap up group and rated their day a 10/10. Pt did not share much in group, stating that they had a "smooth day". Pt stated that they completed their goal, which was to "remain cool, calm and collected. Pt has no complaints to relay to the Dr. At this time.   Kyle Webb 01/26/2024, 9:12 PM

## 2024-01-26 NOTE — Group Note (Signed)
 Recreation Therapy Group Note   Group Topic:Animal Assisted Therapy   Group Date: 01/26/2024 Start Time: 0945 End Time: 1030 Facilitators: Jayna Mulnix-McCall, LRT,CTRS Location: [address] Dayroom   Animal-Assisted Activity (AAA) Program Checklist/Progress Notes Patient Eligibility Criteria Checklist & Daily Group note for Rec Tx Intervention  AAA/T Program Assumption of Risk Form signed by Patient/ or Parent Legal Guardian Yes  Patient understands his/her participation is voluntary Yes  Behavioral Response:    Education: Charity fundraiser, Appropriate Animal Interaction   Education Outcome: Acknowledges education.    Affect/Mood: N/A   Participation Level: Did not attend    Clinical Observations/Individualized Feedback:     Plan: Continue to engage patient in RT group sessions 2-3x/week.   Kyle Webb, LRT,CTRS 01/26/2024 1:13 PM

## 2024-01-27 ENCOUNTER — Encounter (HOSPITAL_COMMUNITY): Payer: Self-pay

## 2024-01-27 NOTE — BH IP Treatment Plan (Signed)
 Interdisciplinary Treatment and Diagnostic Plan Update  01/27/2024 Time of Session: 12:05 PM - UPDATE Kyle Webb MRN: [id number]  Principal Diagnosis: MDD (major depressive disorder), recurrent severe, without psychosis (HCC)  Secondary Diagnoses: Principal Problem:   MDD (major depressive disorder), recurrent severe, without psychosis (HCC) Active Problems:   Alcohol  use disorder, severe, dependence (HCC)   Current Medications:  Current Facility-Administered Medications  Medication Dose Route Frequency Provider Last Rate Last Admin   acamprosate  (CAMPRAL ) tablet 666 mg  666 mg Oral TID Pashayan, Alexander S, DO       acetaminophen  (TYLENOL ) tablet 650 mg  650 mg Oral Q6H PRN Pashayan, Alexander S, DO       alum & mag hydroxide-simeth (MAALOX/MYLANTA) 200-200-20 MG/5ML suspension 30 mL  30 mL Oral Q6H PRN Pashayan, Alexander S, DO       ARIPiprazole  (ABILIFY ) tablet 5 mg  5 mg Oral Daily Pashayan, Alexander S, DO       haloperidol  (HALDOL ) tablet 5 mg  5 mg Oral TID PRN Basilia Bosworth, DO       And   diphenhydrAMINE  (BENADRYL ) capsule 50 mg  50 mg Oral TID PRN Basilia Bosworth, DO       magnesium  hydroxide (MILK OF MAGNESIA) suspension 30 mL  30 mL Oral Daily PRN Pashayan, Alexander S, DO       multivitamin with minerals tablet 1 tablet  1 tablet Oral Daily Pashayan, Alexander S, DO   1 tablet at 01/27/24 4098   nicotine  (NICODERM CQ  - dosed in mg/24 hours) patch 14 mg  14 mg Transdermal Daily Pashayan, Alexander S, DO       thiamine  (Vitamin B-1) tablet 100 mg  100 mg Oral Daily Pashayan, Alexander S, DO   100 mg at 01/27/24 1191   traZODone  (DESYREL ) tablet 50 mg  50 mg Oral QHS PRN Pashayan, Alexander S, DO       PTA Medications: No medications prior to admission.    Patient Stressors:    Patient Strengths:    Treatment Modalities: Medication Management, Group therapy, Case management,  1 to 1 session with clinician, Psychoeducation, Recreational  therapy.   Physician Treatment Plan for Primary Diagnosis: MDD (major depressive disorder), recurrent severe, without psychosis (HCC) Long Term Goal(s): Improvement in symptoms so as ready for discharge   Short Term Goals: Ability to identify changes in lifestyle to reduce recurrence of condition will improve Ability to verbalize feelings will improve Ability to disclose and discuss suicidal ideas Ability to demonstrate self-control will improve Ability to identify and develop effective coping behaviors will improve Ability to maintain clinical measurements within normal limits will improve Compliance with prescribed medications will improve Ability to identify triggers associated with substance abuse/mental health issues will improve  Medication Management: Evaluate patient's response, side effects, and tolerance of medication regimen.  Therapeutic Interventions: 1 to 1 sessions, Unit Group sessions and Medication administration.  Evaluation of Outcomes: Progressing  Physician Treatment Plan for Secondary Diagnosis: Principal Problem:   MDD (major depressive disorder), recurrent severe, without psychosis (HCC) Active Problems:   Alcohol  use disorder, severe, dependence (HCC)  Long Term Goal(s): Improvement in symptoms so as ready for discharge   Short Term Goals: Ability to identify changes in lifestyle to reduce recurrence of condition will improve Ability to verbalize feelings will improve Ability to disclose and discuss suicidal ideas Ability to demonstrate self-control will improve Ability to identify and develop effective coping behaviors will improve Ability to maintain clinical measurements within normal limits will  improve Compliance with prescribed medications will improve Ability to identify triggers associated with substance abuse/mental health issues will improve     Medication Management: Evaluate patient's response, side effects, and tolerance of medication  regimen.  Therapeutic Interventions: 1 to 1 sessions, Unit Group sessions and Medication administration.  Evaluation of Outcomes: Progressing   RN Treatment Plan for Primary Diagnosis: MDD (major depressive disorder), recurrent severe, without psychosis (HCC) Long Term Goal(s): Knowledge of disease and therapeutic regimen to maintain health will improve  Short Term Goals: Ability to remain free from injury will improve, Ability to verbalize frustration and anger appropriately will improve, Ability to participate in decision making will improve, Ability to verbalize feelings will improve, Ability to identify and develop effective coping behaviors will improve, and Compliance with prescribed medications will improve  Medication Management: RN will administer medications as ordered by provider, will assess and evaluate patient's response and provide education to patient for prescribed medication. RN will report any adverse and/or side effects to prescribing provider.  Therapeutic Interventions: 1 on 1 counseling sessions, Psychoeducation, Medication administration, Evaluate responses to treatment, Monitor vital signs and CBGs as ordered, Perform/monitor CIWA, COWS, AIMS and Fall Risk screenings as ordered, Perform wound care treatments as ordered.  Evaluation of Outcomes: Progressing   LCSW Treatment Plan for Primary Diagnosis: MDD (major depressive disorder), recurrent severe, without psychosis (HCC) Long Term Goal(s): Safe transition to appropriate next level of care at discharge, Engage patient in therapeutic group addressing interpersonal concerns.  Short Term Goals: Engage patient in aftercare planning with referrals and resources, Increase ability to appropriately verbalize feelings, Facilitate acceptance of mental health diagnosis and concerns, and Identify triggers associated with mental health/substance abuse issues  Therapeutic Interventions: Assess for all discharge needs, 1 to 1 time  with Social worker, Explore available resources and support systems, Assess for adequacy in community support network, Educate family and significant other(s) on suicide prevention, Complete Psychosocial Assessment, Interpersonal group therapy.  Evaluation of Outcomes: Progressing   Progress in Treatment: Attending groups: Yes. Participating in groups: Yes. Taking medication as prescribed: Yes (some). Toleration medication: Yes. Family/Significant other contact made:  Yes, contacted father, Khayree Delellis [phone] Patient understands diagnosis: No. Discussing patient identified problems/goals with staff: Yes. Medical problems stabilized or resolved: Yes. Denies suicidal/homicidal ideation: Yes. Issues/concerns per patient self-inventory: No.   New problem(s) identified:  No   New Short Term/Long Term Goal(s):  medication stabilization, elimination of SI thoughts, development of comprehensive mental wellness plan.    Patient Goals:  Patient recently admitted. CSW will continue to follow and assess for appropriate referrals and possible discharge planning.      Discharge Plan or Barriers:  Patient recently admitted. CSW will continue to follow and assess for appropriate referrals and possible discharge planning.    Reason for Continuation of Hospitalization: Depression Medication stabilization Suicidal ideation   Estimated Length of Stay:  3 - 5 days  Last 3 [place] Suicide Severity Risk Score: Flowsheet Row [facility] (Discharged) [facility] from [facility]05/2024 [facility]  C-SSRS RISK CATEGORY High Risk High Risk High Risk       Last PHQ 2/9 Scores:    05/21/2022    8:10 AM 03/03/2016    4:48 PM 03/15/2015   12:17 AM  Depression screen PHQ 2/9  Decreased Interest 0 0 0  Down, Depressed, Hopeless  0 0 0  PHQ - 2 Score 0 0 0  Altered sleeping  0 0  Tired, decreased energy  0 0  Change in appetite  1 0  Feeling bad or failure about yourself   0 0  Trouble concentrating  0 0  Moving slowly or fidgety/restless  0 0  Suicidal thoughts  0 0  PHQ-9 Score  1 0    Scribe for Treatment Team: Ernestine Rohman O Magin Balbi, LCSWA 01/27/2024 1:00 PM

## 2024-01-27 NOTE — Plan of Care (Signed)
 Pt presents with preoccupied affect and pleasant mood. Denies SI, HI, AVH, and pain. Pt observed in the milieu socializing with peers and attending group. Cooperative and assertive in interactions with staff. Safety checks maintained at q 15 minutes. Support, encouragement, and reassurance offered to the pt.   Problem: Education: Goal: Emotional status will improve Outcome: Progressing Goal: Mental status will improve Outcome: Progressing Goal: Verbalization of understanding the information provided will improve Outcome: Progressing   Problem: Activity: Goal: Interest or engagement in activities will improve Outcome: Progressing   Problem: Safety: Goal: Periods of time without injury will increase Outcome: Progressing
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
## 2024-01-27 NOTE — Progress Notes (Signed)
   01/27/24 0830  Psych Admission Type (Psych Patients Only)  Admission Status Involuntary  Psychosocial Assessment  Patient Complaints None  Eye Contact Brief  Facial Expression Animated  Affect Preoccupied  Speech Logical/coherent  Interaction Assertive  Motor Activity Slow  Appearance/Hygiene Unremarkable  Behavior Characteristics Cooperative  Mood Pleasant  Thought Process  Coherency WDL  Content WDL  Delusions None reported or observed  Perception WDL  Hallucination None reported or observed  Judgment WDL  Confusion None  Danger to Self  Current suicidal ideation? Denies  Agreement Not to Harm Self Yes  Description of Agreement verbal  Danger to Others  Danger to Others None reported or observed

## 2024-01-27 NOTE — [facility] Group Notes (Signed)
 Spirituality Group   Description: Participant directed exploration of values, beliefs and meaning   Following a brief framework of chaplain's role and ground rules of group behavior, participants are invited to share concerns or questions that engage spiritual life. Emphasis placed on common themes and shared experiences and ways to make meaning and clarify living into one's values.   Theory/Process/Goal: Utilize the theoretical framework of group therapy established by Derrell Flight, Relational Cultural Theory and Rogerian approaches to facilitate relational empathy and use of the "here and now" to foster reflection, self-awareness, and sharing.   Observations: Kyle Webb was an active [participant int he group discussion, the most engaged compared to previous groups.  [name] [name] Aly, M.Div [phone]

## 2024-01-27 NOTE — Plan of Care (Signed)
  Problem: Education: Goal: Knowledge of [facility] Education information/materials will improve Outcome: Progressing Goal: Mental status will improve Outcome: Progressing Goal: Verbalization of understanding the information provided will improve Outcome: Progressing   Problem: Activity: Goal: Interest or engagement in activities will improve Outcome: Progressing

## 2024-01-27 NOTE — Group Note (Signed)
 Recreation Therapy Group Note   Group Topic:Communication  Group Date: 01/27/2024 Start Time: 8119 End Time: 1000 Facilitators: Adelei Scobey-McCall, LRT,CTRS Location: [address] Dayroom   Group Topic: Communication, Team Building, Problem Solving  Goal Area(s) Addresses:  Patient will effectively work with peer towards shared goal.  Patient will identify skills used to make activity successful.  Patient will identify how skills used during activity can be applied to reach post d/c goals.   Behavioral Response: Appropriate  Intervention: STEM Activity- Glass blower/designer  Activity: Tallest [employer]. In teams of 5-6, patients were given 11 craft pipe cleaners. Using the materials provided, patients were instructed to compete again the opposing team(s) to build the tallest free-standing structure from floor level. The activity was timed; difficulty increased by Clinical research associate as Production designer, theatre/television/film continued.  Systematically resources were removed with additional directions for example, placing one arm behind their back, working in silence, and shape stipulations. LRT facilitated post-activity discussion reviewing team processes and necessary communication skills involved in completion. Patients were encouraged to reflect how the skills utilized, or not utilized, in this activity can be incorporated to positively impact support systems post discharge.  Education: Pharmacist, community, Scientist, physiological, Discharge Planning   Education Outcome: Acknowledges education/In group clarification offered/Needs additional education.    Affect/Mood: Appropriate   Participation Level: Engaged   Participation Quality: Independent   Behavior: Appropriate   Speech/Thought Process: Focused   Insight: Good   Judgement: Good   Modes of Intervention: STEM Activity   Patient Response to Interventions:  Engaged   Education Outcome:  In group clarification offered    Clinical  Observations/Individualized Feedback: Pt was very engaging with peers in coming up with ideas and putting them in action. Pt offered suggestions and listened to suggestions of peers. Pt was appropriate and on task during group.     Plan: Continue to engage patient in RT group sessions 2-3x/week.   Maytte Jacot-McCall, LRT,CTRS 01/27/2024 12:55 PM
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
## 2024-01-28 MED ORDER — VITAMIN B-1 100 MG PO TABS
100.0000 mg | ORAL_TABLET | Freq: Every day | ORAL | Status: DC
  Administered 2024-01-29 – 2024-02-02 (×5): 100 mg via ORAL
  Filled 2024-01-28 (×4): qty 1

## 2024-01-28 NOTE — Plan of Care (Signed)
   Problem: Education: Goal: Emotional status will improve Outcome: Progressing Goal: Mental status will improve Outcome: Progressing Goal: Verbalization of understanding the information provided will improve Outcome: Progressing   Problem: Activity: Goal: Interest or engagement in activities will improve Outcome: Progressing
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
## 2024-01-28 NOTE — Group Note (Signed)
 Therapy Group Note  Group Topic:Other  Group Date: 01/28/2024 Start Time: 1400 End Time: 1528 Facilitators: Lynnda Sas, OT    The objective of today's group is to provide a comprehensive understanding of the concept of "motivation" and its role in human behavior and well-being. The content covers various theories of motivation, including intrinsic and extrinsic motivators, and explores the psychological mechanisms that drive individuals to achieve goals, overcome obstacles, and make decisions. By diving into real-world applications, the group aims to offer actionable strategies for enhancing motivation in different life domains, such as work, relationships, and personal growth.  Utilizing a multi-disciplinary approach, this group integrates insights from psychology, neuroscience, and behavioral economics to present a holistic view of motivation. The objective is not only to educate the audience about the complexities and driving forces behind motivation but also to equip them with practical tools and techniques to improve their own motivation levels. By the end of this multi-day group, patient's should have a well-rounded understanding of what motivates human actions and how to harness this knowledge for personal and professional betterment.   Kyle Webb, OT     Participation Level: Engaged   Participation Quality: Independent   Behavior: Appropriate   Speech/Thought Process: Relevant   Affect/Mood: Appropriate   Insight: Fair   Judgement: Fair      Modes of Intervention: Education  Patient Response to Interventions:  Attentive   Plan: Continue to engage patient in OT groups 2 - 3x/week.  01/28/2024  Lynnda Sas, OT   Kyle Webb, OT

## 2024-01-28 NOTE — Progress Notes (Signed)
   01/28/24 0800  Psych Admission Type (Psych Patients Only)  Admission Status Involuntary  Psychosocial Assessment  Patient Complaints None  Eye Contact Brief  Facial Expression Animated  Affect Preoccupied  Speech Logical/coherent  Interaction Assertive  Motor Activity Slow  Appearance/Hygiene Unremarkable  Behavior Characteristics Cooperative  Mood Pleasant  Thought Process  Coherency WDL  Content WDL  Delusions None reported or observed  Perception WDL  Hallucination None reported or observed  Judgment WDL  Confusion None  Danger to Self  Current suicidal ideation? Denies  Agreement Not to Harm Self Yes  Description of Agreement verbal  Danger to Others  Danger to Others None reported or observed

## 2024-01-28 NOTE — Group Note (Signed)
 LCSW Group Therapy Note   Group Date: 01/28/2024 Start Time: 1100 End Time: 1200   Participation:  patient was present and actively participated in the discussion   Type of Therapy:  Group Therapy  Topic:  Shining from Within:  Confidence and Self-love Journey  Objective:  To support participants in developing confidence and self-love through self-awareness, self-compassion, and practical skills that nurture personal growth.   Group Goals Encourage self-reflection and self-acceptance by identifying personal strengths and achievements. Teach skills to challenge negative self-talk and replace it with supportive, truthful self-talk. Foster resilience and self-worth through [employer], gratitude, and self-care practices.   Summary:  This group explores the connection between confidence and self-love by guiding participants through reflection, mindset shifts, and practical tools like affirmations, strength recognition, and goal-setting. Activities are designed to promote self-compassion, build emotional resilience, and normalize the slow, patient journey of inner growth.   Therapeutic Modalities Used: Elements of Cognitive Behavioral Therapy (CBT): Challenging and reframing unhelpful self-talk. Elements of Motivational Interviewing (MI): Encouraging small, achievable goals. Elements of Dialectical Behavioral Therapist (DBT):  Mindfulness and Self-Compassion: Promoting present-moment awareness and kindness toward self.   Tailor Lucking O Octavia Mottola, LCSWA 01/28/2024  12:10 PM

## 2024-01-28 NOTE — Plan of Care (Signed)
   Problem: Education: Goal: Knowledge of [facility] Education information/materials will improve Outcome: Progressing Goal: Emotional status will improve Outcome: Progressing Goal: Mental status will improve Outcome: Progressing Goal: Verbalization of understanding the information provided will improve Outcome: Progressing

## 2024-01-28 NOTE — Group Note (Signed)
 Date:  01/28/2024 Time:  8:46 PM  Group Topic/Focus:  Wrap-Up Group:   The focus of this group is to help patients review their daily goal of treatment and discuss progress on daily workbooks.    Participation Level:  Did Not Attend  Participation Quality:  N/A  Affect:  N/A  Cognitive:  N/A  Insight: None  Engagement in Group:  N/A  Modes of Intervention:  N/A  Additional Comments:  Patient did not attend wrap up group.  Dillard Frame 01/28/2024, 8:46 PM

## 2024-01-29 DIAGNOSIS — F332 Major depressive disorder, recurrent severe without psychotic features: Secondary | ICD-10-CM | POA: Diagnosis not present

## 2024-01-29 DIAGNOSIS — F1020 Alcohol dependence, uncomplicated: Secondary | ICD-10-CM | POA: Diagnosis not present
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
## 2024-01-29 NOTE — Plan of Care (Signed)
  Problem: Education: Goal: Emotional status will improve Outcome: Progressing Goal: Verbalization of understanding the information provided will improve Outcome: Progressing   Problem: Health Behavior/Discharge Planning: Goal: Compliance with treatment plan for underlying cause of condition will improve Outcome: Progressing

## 2024-01-29 NOTE — [facility] Group Notes (Signed)
 Adult Psychoeducational Group Note  Date:  01/29/2024 Time:  11:14 PM  Group Topic/Focus:  Wrap-Up Group:   The focus of this group is to help patients review their daily goal of treatment and discuss progress on daily workbooks.  Participation Level:  Active  Participation Quality:  Attentive  Affect:  Appropriate  Cognitive:  Alert  Insight: Improving  Engagement in Group:  Engaged  Modes of Intervention:  Discussion  Additional Comments:  Pt attended AA group.  Kyle Webb Cassandra 01/29/2024, 11:14 PM

## 2024-01-29 NOTE — Progress Notes (Signed)
   01/29/24 0800  Psych Admission Type (Psych Patients Only)  Admission Status Involuntary  Psychosocial Assessment  Patient Complaints None  Eye Contact Fair  Facial Expression Animated  Affect Preoccupied  Speech Logical/coherent  Interaction Assertive  Motor Activity Slow  Appearance/Hygiene Unremarkable  Behavior Characteristics Cooperative;Appropriate to situation  Mood Pleasant  Thought Process  Coherency WDL  Content WDL  Delusions None reported or observed  Perception WDL  Hallucination None reported or observed  Judgment WDL  Confusion None  Danger to Self  Current suicidal ideation? Denies  Agreement Not to Harm Self Yes  Description of Agreement verbal  Danger to Others  Danger to Others None reported or observed

## 2024-01-29 NOTE — Plan of Care (Signed)
   Problem: Education: Goal: Knowledge of [facility] Education information/materials will improve Outcome: Progressing Goal: Emotional status will improve Outcome: Progressing Goal: Mental status will improve Outcome: Progressing Goal: Verbalization of understanding the information provided will improve Outcome: Progressing

## 2024-01-29 NOTE — Group Note (Signed)
 Date:  01/29/2024 Time:  8:51 AM  Group Topic/Focus:  Goals Group:   The focus of this group is to help patients establish daily goals to achieve during treatment and discuss how the patient can incorporate goal setting into their daily lives to aide in recovery.    Participation Level:  Active  Participation Quality:  Appropriate  Affect:  Appropriate  Ellan Gunner 01/29/2024, 8:51 AM

## 2024-01-29 NOTE — Group Note (Signed)
 Recreation Therapy Group Note   Group Topic:Leisure Education  Group Date: 01/29/2024 Start Time: 0935 End Time: 1005 Facilitators: Kyle Webb, LRT,CTRS Location: [address] Dayroom   Group Topic: Leisure Education   Goal Area(s) Addresses:  Patient will successfully identify positive leisure and recreation activities.  Patient will acknowledge benefits of participation in healthy leisure activities post discharge.  Patient will actively work with peers toward a shared goal.   Behavioral Response: Engaged   Intervention: Competitive Group Game    Activity: IT trainer. In groups of 5-7, patients took turns trying to guess the picture being drawn on the board by their teammate.  If the team guessed the correct answer, they won a point.  If the team guessed wrong, the other team got a chance to steal the point. After several rounds of game play, the team with the most points were declared winners. Post-activity discussion reviewed benefits of positive recreation outlets: reducing stress, improving coping mechanisms, increasing self-esteem, and building larger support systems.   Education:  Teacher, English as a foreign language, Leisure as Merchant navy officer, Programmer, applications, Building control surveyor   Education Outcome: Acknowledges education/In group clarification offered/Needs additional education   Affect/Mood: Appropriate   Participation Level: Engaged   Participation Quality: Independent   Behavior: Appropriate   Speech/Thought Process: Focused   Insight: Good   Judgement: Good   Modes of Intervention: Competitive Play   Patient Response to Interventions:  Engaged   Education Outcome:  In group clarification offered    Clinical Observations/Individualized Feedback: Pt was engaged, energetic and artistic during activity. Pt was bright and interacted well with peers.     Plan: Continue to engage patient in RT group sessions 2-3x/week.   Kyle Webb,  LRT,CTRS 01/29/2024 12:12 PM

## 2024-01-30 DIAGNOSIS — F332 Major depressive disorder, recurrent severe without psychotic features: Secondary | ICD-10-CM | POA: Diagnosis not present

## 2024-01-30 NOTE — [facility] Group Notes (Addendum)
 LCSW Wellness Group Note   01/30/2024 1:00pm  Type of Group and Topic: Psychoeducational Group:  Wellness  Participation Level:  minimal  Description of Group  Wellness group introduces the topic and its focus on developing healthy habits across the spectrum and its relationship to a decrease in hospital admissions.  Six areas of wellness are discussed: physical, social spiritual, intellectual, occupational, and emotional.  Patients are asked to consider their current wellness habits and to identify areas of wellness where they are interested and able to focus on improvements.    Therapeutic Goals Patients will understand components of wellness and how they can positively impact overall health.  Patients will identify areas of wellness where they have developed good habits. Patients will identify areas of wellness where they would like to make improvements.    Summary of Patient Progress: pt somewhat distracted with other activities during group, did read wellness area to the group when requested to by CSW.  Pt identified physical, financial, intellectual as wellness areas of strength.  Pt identified environmental as wellness area that needs work.       Therapeutic Modalities: Cognitive Behavioral Therapy Psychoeducation    Elspeth Hals, LCSW

## 2024-01-30 NOTE — Group Note (Signed)
 Date:  01/30/2024 Time:  9:49 AM  Group Topic/Focus:  Goals Group:   The focus of this group is to help patients establish daily goals to achieve during treatment and discuss how the patient can incorporate goal setting into their daily lives to aide in recovery. Orientation:   The focus of this group is to educate the patient on the purpose and policies of crisis stabilization and provide a format to answer questions about their admission.  The group details unit policies and expectations of patients while admitted.    Participation Level:  Active  Participation Quality:  Appropriate  Affect:  Appropriate  Cognitive:  Appropriate  Insight: Appropriate  Engagement in Group:  Engaged  Modes of Intervention:  Orientation  Additional Comments:    Kyle Webb 01/30/2024, 9:49 AM

## 2024-01-30 NOTE — Plan of Care (Signed)
  Problem: Education: Goal: Emotional status will improve Outcome: Progressing Goal: Verbalization of understanding the information provided will improve Outcome: Progressing   Problem: Activity: Goal: Interest or engagement in activities will improve Outcome: Progressing   Problem: Coping: Goal: Ability to verbalize frustrations and anger appropriately will improve Outcome: Progressing

## 2024-01-30 NOTE — Progress Notes (Signed)
 Writer attempt to do 15 minutes safety checks. As Clinical research associate enter room 404 bed 1 center of the floor at an angle. Writer explain this is a safety hazard. The patient in bed 2 has to safely be able to walk to the bathroom room  without walking into objects. Writer asked Kyle Webb to move the bed back in place as it was before he moved the bed. Kyle Webb agreed that he move the bed away from the wall to  stretch to see how far the bed can move. Kyle Webb moved the bed back. RN notify.

## 2024-01-30 NOTE — Progress Notes (Signed)
 Pt visible in the milieu.  Interacting appropriately with staff and peers.  Attending group.  Pt rated depression and anxiety at 0 today.  Pt reports he feels he is ready for d/c.  Pt denied needs.  Pt denied SI, HI and AVH.  Support provided.  Pt receptive.  Safety checks continue for patient safety.  Pt safe on unit.

## 2024-01-30 NOTE — [facility] Group Notes (Signed)
 [facility] Group Notes:  (Nursing/MHT/Case Management/Adjunct)  Date:  01/30/2024  Time:  2000  Type of Therapy:  Wrap up group  Participation Level:  Active  Participation Quality:  Appropriate, Sharing, and Supportive  Affect:  Appropriate  Cognitive:  Alert  Insight:  Improving  Engagement in Group:  Engaged  Modes of Intervention:  Clarification, Education, and Support  Summary of Progress/Problems: Positive thinking and positive change were discussed.   Catharine Clock 01/30/2024, 9:42 PM
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
## 2024-01-30 NOTE — Progress Notes (Signed)
   01/30/24 0800  Psych Admission Type (Psych Patients Only)  Admission Status Involuntary  Psychosocial Assessment  Patient Complaints None  Eye Contact Fair  Facial Expression Animated  Affect Appropriate to circumstance  Speech Logical/coherent  Interaction Assertive  Motor Activity Other (Comment) (WDL)  Appearance/Hygiene Unremarkable  Behavior Characteristics Cooperative  Mood Pleasant  Thought Process  Coherency WDL  Content WDL  Delusions None reported or observed  Perception WDL  Hallucination None reported or observed  Judgment WDL  Confusion None  Danger to Self  Current suicidal ideation? Denies  Agreement Not to Harm Self Yes  Description of Agreement verbal  Danger to Others  Danger to Others None reported or observed     01/30/24 0800  Psych Admission Type (Psych Patients Only)  Admission Status Involuntary  Psychosocial Assessment  Patient Complaints None  Eye Contact Fair  Facial Expression Animated  Affect Appropriate to circumstance  Speech Logical/coherent  Interaction Assertive  Motor Activity Other (Comment) (WDL)  Appearance/Hygiene Unremarkable  Behavior Characteristics Cooperative  Mood Pleasant  Thought Process  Coherency WDL  Content WDL  Delusions None reported or observed  Perception WDL  Hallucination None reported or observed  Judgment WDL  Confusion None  Danger to Self  Current suicidal ideation? Denies  Agreement Not to Harm Self Yes  Description of Agreement verbal  Danger to Others  Danger to Others None reported or observed

## 2024-01-31 DIAGNOSIS — F332 Major depressive disorder, recurrent severe without psychotic features: Secondary | ICD-10-CM | POA: Diagnosis not present

## 2024-01-31 NOTE — [facility] Group Notes (Signed)
 [facility] Group Notes:  (Nursing/MHT/Case Management/Adjunct)  Date:  01/31/2024  Time:  9:36 PM  Type of Therapy:  Wrap-up group  Participation Level:  Active  Participation Quality:  Appropriate  Affect:  Appropriate  Cognitive:  Appropriate  Insight:  Appropriate  Engagement in Group:  Engaged  Modes of Intervention:  Education  Summary of Progress/Problems: Goal To think before you speak. Rated day 9/10.  Kyle Webb 01/31/2024, 9:36 PM

## 2024-01-31 NOTE — Plan of Care (Signed)
 Pt A/O x4 with noted MDD. Pt denied SI/HI; A/V/H. Pt agreed to notify staff if thoughts become overwhelming. Meds whole. Snacks/Fluids offered. ADL's self. Ambulate with a steady gait. Pt mood: Sarcastic. Pt was encouraged to attend Group. Tolerated well. Pt in bed eyes closed with equal rise and fall of chest. No distress noted Continuing of care continues during 7p-7a shift.

## 2024-01-31 NOTE — Progress Notes (Signed)
   01/30/24 2030  Psych Admission Type (Psych Patients Only)  Admission Status Involuntary  Psychosocial Assessment  Eye Contact  (WNL)  Facial Expression Other (Comment) (WNL)  Affect Other (Comment) (WNL)  Speech UTA  Interaction Sarcastic  Motor Activity Other (Comment) (WNL)  Appearance/Hygiene Other (Comment) (Appropriate)  Behavior Characteristics Calm  Mood Pleasant  Thought Process  Coherency WDL  Content WDL  Delusions None reported or observed  Perception WDL  Hallucination None reported or observed  Judgment WDL  Confusion None  Danger to Self  Current suicidal ideation?  (Denies)  Agreement Not to Harm Self Yes  Description of Agreement Notify Staff  Danger to Others  Danger to Others None reported or observed

## 2024-01-31 NOTE — Group Note (Signed)
 Date:  01/31/2024 Time:  9:21 AM  Group Topic/Focus:  Goals Group:   The focus of this group is to help patients establish daily goals to achieve during treatment and discuss how the patient can incorporate goal setting into their daily lives to aide in recovery. Orientation:   The focus of this group is to educate the patient on the purpose and policies of crisis stabilization and provide a format to answer questions about their admission.  The group details unit policies and expectations of patients while admitted.    Participation Level:  Active  Participation Quality:  Appropriate  Affect:  Appropriate  Cognitive:  Appropriate  Insight: Appropriate  Engagement in Group:  Engaged  Modes of Intervention:  Discussion and Orientation  Additional Comments:    Lameisha Schuenemann D Jeniel Slauson 01/31/2024, 9:21 AM

## 2024-01-31 NOTE — Plan of Care (Signed)
  Problem: Activity: Goal: Interest or engagement in activities will improve Outcome: Progressing   Problem: Health Behavior/Discharge Planning: Goal: Compliance with treatment plan for underlying cause of condition will improve Outcome: Not Progressing

## 2024-01-31 NOTE — Progress Notes (Signed)
   01/31/24 1100  Psych Admission Type (Psych Patients Only)  Admission Status Involuntary  Psychosocial Assessment  Patient Complaints None  Eye Contact Fair  Facial Expression Animated  Affect Appropriate to circumstance  Speech Logical/coherent  Interaction Assertive  Motor Activity Other (Comment) (WNL)  Appearance/Hygiene Unremarkable  Behavior Characteristics Cooperative;Calm  Mood Pleasant  Thought Process  Coherency WDL  Content WDL  Delusions None reported or observed  Perception WDL  Hallucination None reported or observed  Judgment WDL  Confusion None  Danger to Self  Current suicidal ideation? Denies  Agreement Not to Harm Self Yes  Description of Agreement Verbal Contract  Danger to Others  Danger to Others None reported or observed
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
## 2024-01-31 NOTE — Group Note (Signed)
 Date:  01/31/2024 Time:  2:38 PM  Group Topic/Focus:   Healthy Communication:   The focus of this group is to discuss boundaries, barriers to setting effective boundaries, as well as healthy ways to communicate them with others.     Participation Level:  Active  Participation Quality:  Appropriate  Affect:  Appropriate  Cognitive:  Appropriate  Insight: Appropriate  Engagement in Group:  Engaged  Modes of Intervention:  Discussion and Education  Additional Comments:    Sheryl Donna 01/31/2024, 2:38 PM

## 2024-02-01 ENCOUNTER — Encounter (HOSPITAL_COMMUNITY): Payer: Self-pay

## 2024-02-01 DIAGNOSIS — F1020 Alcohol dependence, uncomplicated: Secondary | ICD-10-CM

## 2024-02-01 DIAGNOSIS — F332 Major depressive disorder, recurrent severe without psychotic features: Secondary | ICD-10-CM | POA: Diagnosis not present

## 2024-02-01 MED ORDER — VITAMIN B-1 100 MG PO TABS
100.0000 mg | ORAL_TABLET | Freq: Every day | ORAL | Status: DC
Start: 2024-02-02 — End: 2024-02-06

## 2024-02-01 MED ORDER — ADULT MULTIVITAMIN W/MINERALS CH: 1.0000 | ORAL_TABLET | Freq: Every day | ORAL | Status: DC

## 2024-02-01 NOTE — BH IP Treatment Plan (Signed)
 Interdisciplinary Treatment and Diagnostic Plan Update  02/01/2024 Time of Session: 12:05 PM - UPDATE Kyle Webb MRN: [id number]  Principal Diagnosis: MDD (major depressive disorder), recurrent severe, without psychosis (HCC)  Secondary Diagnoses: Principal Problem:   MDD (major depressive disorder), recurrent severe, without psychosis (HCC) Active Problems:   Alcohol  use disorder, severe, dependence (HCC)   Current Medications:  Current Facility-Administered Medications  Medication Dose Route Frequency Provider Last Rate Last Admin   acamprosate  (CAMPRAL ) tablet 666 mg  666 mg Oral TID Pashayan, Alexander S, DO       acetaminophen  (TYLENOL ) tablet 650 mg  650 mg Oral Q6H PRN Pashayan, Alexander S, DO       alum & mag hydroxide-simeth (MAALOX/MYLANTA) 200-200-20 MG/5ML suspension 30 mL  30 mL Oral Q6H PRN Pashayan, Alexander S, DO       ARIPiprazole  (ABILIFY ) tablet 5 mg  5 mg Oral Daily Pashayan, Alexander S, DO       haloperidol  (HALDOL ) tablet 5 mg  5 mg Oral TID PRN Basilia Bosworth, DO       And   diphenhydrAMINE  (BENADRYL ) capsule 50 mg  50 mg Oral TID PRN Basilia Bosworth, DO       magnesium  hydroxide (MILK OF MAGNESIA) suspension 30 mL  30 mL Oral Daily PRN Basilia Bosworth, DO       multivitamin with minerals tablet 1 tablet  1 tablet Oral Daily Pashayan, Alexander S, DO   1 tablet at 02/01/24 0805   nicotine  (NICODERM CQ  - dosed in mg/24 hours) patch 14 mg  14 mg Transdermal Daily Pashayan, Alexander S, DO       thiamine  (Vitamin B-1) tablet 100 mg  100 mg Oral Daily Attiah, Nadir, MD   100 mg at 02/01/24 0805   traZODone  (DESYREL ) tablet 50 mg  50 mg Oral QHS PRN Pashayan, Alexander S, DO       PTA Medications: No medications prior to admission.    Patient Stressors:    Patient Strengths:    Treatment Modalities: Medication Management, Group therapy, Case management,  1 to 1 session with clinician, Psychoeducation, Recreational  therapy.   Physician Treatment Plan for Primary Diagnosis: MDD (major depressive disorder), recurrent severe, without psychosis (HCC) Long Term Goal(s): Improvement in symptoms so as ready for discharge   Short Term Goals: Ability to identify changes in lifestyle to reduce recurrence of condition will improve Ability to verbalize feelings will improve Ability to disclose and discuss suicidal ideas Ability to demonstrate self-control will improve Ability to identify and develop effective coping behaviors will improve Ability to maintain clinical measurements within normal limits will improve Compliance with prescribed medications will improve Ability to identify triggers associated with substance abuse/mental health issues will improve  Medication Management: Evaluate patient's response, side effects, and tolerance of medication regimen.  Therapeutic Interventions: 1 to 1 sessions, Unit Group sessions and Medication administration.  Evaluation of Outcomes: Progressing  Physician Treatment Plan for Secondary Diagnosis: Principal Problem:   MDD (major depressive disorder), recurrent severe, without psychosis (HCC) Active Problems:   Alcohol  use disorder, severe, dependence (HCC)  Long Term Goal(s): Improvement in symptoms so as ready for discharge   Short Term Goals: Ability to identify changes in lifestyle to reduce recurrence of condition will improve Ability to verbalize feelings will improve Ability to disclose and discuss suicidal ideas Ability to demonstrate self-control will improve Ability to identify and develop effective coping behaviors will improve Ability to maintain clinical measurements within normal limits will improve  Compliance with prescribed medications will improve Ability to identify triggers associated with substance abuse/mental health issues will improve     Medication Management: Evaluate patient's response, side effects, and tolerance of medication  regimen.  Therapeutic Interventions: 1 to 1 sessions, Unit Group sessions and Medication administration.  Evaluation of Outcomes: Progressing   RN Treatment Plan for Primary Diagnosis: MDD (major depressive disorder), recurrent severe, without psychosis (HCC) Long Term Goal(s): Knowledge of disease and therapeutic regimen to maintain health will improve  Short Term Goals: Ability to remain free from injury will improve, Ability to verbalize frustration and anger appropriately will improve, Ability to verbalize feelings will improve, and Ability to disclose and discuss suicidal ideas  Medication Management: RN will administer medications as ordered by provider, will assess and evaluate patient's response and provide education to patient for prescribed medication. RN will report any adverse and/or side effects to prescribing provider.  Therapeutic Interventions: 1 on 1 counseling sessions, Psychoeducation, Medication administration, Evaluate responses to treatment, Monitor vital signs and CBGs as ordered, Perform/monitor CIWA, COWS, AIMS and Fall Risk screenings as ordered, Perform wound care treatments as ordered.  Evaluation of Outcomes: Progressing   LCSW Treatment Plan for Primary Diagnosis: MDD (major depressive disorder), recurrent severe, without psychosis (HCC) Long Term Goal(s): Safe transition to appropriate next level of care at discharge, Engage patient in therapeutic group addressing interpersonal concerns.  Short Term Goals: Engage patient in aftercare planning with referrals and resources, Increase ability to appropriately verbalize feelings, Facilitate acceptance of mental health diagnosis and concerns, and Identify triggers associated with mental health/substance abuse issues  Therapeutic Interventions: Assess for all discharge needs, 1 to 1 time with Social worker, Explore available resources and support systems, Assess for adequacy in community support network, Educate family  and significant other(s) on suicide prevention, Complete Psychosocial Assessment, Interpersonal group therapy.  Evaluation of Outcomes: Progressing   Progress in Treatment: Attending groups: Yes. Participating in groups: Yes. Taking medication as prescribed: Yes (some). Toleration medication: Yes. Family/Significant other contact made:  Yes, contacted father, Keysean Savino [phone] Patient understands diagnosis: No. Discussing patient identified problems/goals with staff: Yes. Medical problems stabilized or resolved: Yes. Denies suicidal/homicidal ideation: Yes. Issues/concerns per patient self-inventory: No.   New problem(s) identified:  No   New Short Term/Long Term Goal(s):  medication stabilization, elimination of SI thoughts, development of comprehensive mental wellness plan.    Patient Goals:  Patient recently admitted. CSW will continue to follow and assess for appropriate referrals and possible discharge planning.      Discharge Plan or Barriers:  Patient recently admitted. CSW will continue to follow and assess for appropriate referrals and possible discharge planning.    Reason for Continuation of Hospitalization: Depression Medication stabilization Suicidal ideation   Estimated Length of Stay:  1 - 2 days  Last 3 [place] Suicide Severity Risk Score: Flowsheet Row [facility] (Discharged) [facility] from 01/05/2024 in [facility]  C-SSRS RISK CATEGORY High Risk High Risk High Risk       Last PHQ 2/9 Scores:    05/21/2022    8:10 AM 03/03/2016    4:48 PM 03/15/2015   12:17 AM  Depression screen PHQ 2/9  Decreased Interest 0 0 0  Down, Depressed, Hopeless 0 0 0  PHQ - 2 Score 0 0 0  Altered sleeping  0 0  Tired, decreased energy  0 0  Change in appetite  1 0  Feeling bad or failure  about yourself   0 0  Trouble concentrating  0 0  Moving slowly or fidgety/restless  0 0  Suicidal thoughts  0 0  PHQ-9 Score  1 0    Scribe for Treatment Team: Braeden Dolinski O Melquiades Kovar, LCSWA 02/01/2024 5:22 PM

## 2024-02-01 NOTE — Plan of Care (Signed)
   Problem: Education: Goal: Emotional status will improve Outcome: Progressing   Problem: Activity: Goal: Interest or engagement in activities will improve Outcome: Progressing

## 2024-02-01 NOTE — [facility] Group Notes (Signed)
 Spirituality Group   Group Goal: Support / Education around grief and loss    Group Description: Following introductions and group rules, group members engaged in facilitated group dialog and support around topic of loss, with particular support around experiences of loss in their lives. Group members identified types of loss (relationships / self / things) as well as patterns, circumstances, and changes that precipitate loss. Reflection invited on thoughts / feelings around loss, normalized grief responses, and recognized variety in grief experience. Group noted Worden's four tasks of grief in discussion. Group drew on Adlerian / Rogerian, narrative, MI, with Yalom's group therapy as a primary framework.      Observations: Kyle Webb was an active participant in the group discussion.  Kyle Webb L. Kyle Webb, M.Div [phone]

## 2024-02-01 NOTE — Plan of Care (Signed)
 Pt A/O x4 with noted MDD. Pt able to verbalize needs. Meds whole. Snacks/fluids offered. ADL's self. Ambulates with a steady gait. Pt's mood is friendly. Pt denies SI/HI; A/V/H.Pt in bed eyes closed with equal rise and fall of chest. No noted distress. Continuing of care during 7p-7a shift.
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
## 2024-02-01 NOTE — Progress Notes (Signed)
   01/31/24 2040  Psych Admission Type (Psych Patients Only)  Admission Status Involuntary  Psychosocial Assessment  Patient Complaints None  Eye Contact Fair  Facial Expression Flat  Affect Appropriate to circumstance  Speech Logical/coherent  Interaction Assertive  Motor Activity Other (Comment) (Denies)  Appearance/Hygiene Unremarkable  Behavior Characteristics Cooperative;Calm  Mood Pleasant  Thought Process  Coherency WDL  Content WDL  Delusions None reported or observed  Perception WDL  Hallucination None reported or observed  Judgment WDL  Confusion None  Danger to Self  Current suicidal ideation?  (Denies)  Agreement Not to Harm Self Yes  Description of Agreement Notify Staff  Danger to Others  Danger to Others None reported or observed
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
## 2024-02-01 NOTE — Progress Notes (Signed)
   02/01/24 1000  Psych Admission Type (Psych Patients Only)  Admission Status Involuntary  Psychosocial Assessment  Patient Complaints None  Eye Contact Fair  Facial Expression Flat  Affect Appropriate to circumstance  Speech Logical/coherent  Interaction Assertive  Motor Activity Other (Comment) (WNL)  Appearance/Hygiene Unremarkable  Behavior Characteristics Cooperative;Calm  Mood Pleasant  Thought Process  Coherency WDL  Content WDL  Delusions None reported or observed  Perception WDL  Hallucination None reported or observed  Judgment WDL  Confusion None  Danger to Self  Current suicidal ideation? Denies  Agreement Not to Harm Self Yes  Description of Agreement Verbal Contract  Danger to Others  Danger to Others None reported or observed

## 2024-02-01 NOTE — B[facility] Suicide Risk Assessment (Signed)
 [facility] Discharge Suicide Risk Assessment   Principal Problem: MDD (major depressive disorder), recurrent severe, without psychosis (HCC) Discharge Diagnoses: Principal Problem:   MDD (major depressive disorder), recurrent severe, without psychosis (HCC) Active Problems:   Alcohol  use disorder, severe, dependence (HCC)   Total Time spent with patient: 30 minutes  Musculoskeletal: Strength & Muscle Tone: within normal limits Gait & Station: normal Patient leans: N/A  Psychiatric Specialty Exam  Presentation  General Appearance:  Appropriate for Environment  Eye Contact: Fair  Speech: Clear and Coherent  Speech Volume: Normal  Handedness: Right   Mood and Affect  Mood: Euthymic  Duration of Depression Symptoms: Greater than two weeks  Affect: Appropriate   Thought Process  Thought Processes: Goal Directed  Descriptions of Associations:Intact  Orientation:Full (Time, Place and Person)  Thought Content:Logical  History of Schizophrenia/Schizoaffective disorder:No  Duration of Psychotic Symptoms:No data recorded Hallucinations:Hallucinations: None  Ideas of Reference:None  Suicidal Thoughts:Suicidal Thoughts: No  Homicidal Thoughts:Homicidal Thoughts: No   Sensorium  Memory: Immediate Fair  Judgment: Fair  Insight: Fair   Art therapist  Concentration: Fair  Attention Span: Fair  Recall: [employer] of Knowledge: Fair  Language: Fair   Psychomotor Activity  Psychomotor Activity: Psychomotor Activity: Normal   Assets  Assets: Communication Skills; Desire for Improvement; Physical Health; Resilience; Social Support; Vocational/Educational   Sleep  Sleep: Sleep: Fair Number of Hours of Sleep: 6.45   Physical Exam: Physical Exam ROS Blood pressure 105/83, pulse 71, temperature 97.7 F (36.5 C), resp. rate 16, height 5\' 7"  (1.702 m), weight 70.3 kg, SpO2 99%. Body mass index is 24.28 kg/m.  Mental Status Per  Nursing Assessment::   On Admission:  Suicidal ideation indicated by others  Demographic Factors:  Male, Caucasian, and Living alone  Loss Factors: NA  Historical Factors: Impulsivity  Risk Reduction Factors:   Sense of responsibility to family, Employed, Positive social support, and Positive coping skills or problem solving skills  Continued Clinical Symptoms:  Alcohol /Substance Abuse/Dependencies  Cognitive Features That Contribute To Risk:  None    Suicide Risk:  Minimal: No identifiable suicidal ideation.  Patients presenting with no risk factors but with morbid ruminations; may be classified as minimal risk based on the severity of the depressive symptoms  Patient denies SI or HI for >48 hours. Denies wanting to be dead and future oriented. SRA complete and acute risk for suicide is low.   [place] Suicide Risk assessment:  1. Do you wish to be dead? NONE REPORTED 2. Have you wished your dead or wished you could go to sleep and not wake up? NONE REPORTED 3.  Have you actually had thoughts of killing yourself?  NONE REPORTED 4.  Have you been thinking about how you might do this?  NONE REPORTED 5.  Have you had these thoughts and some intention of acting on them? NONE REPORTED 6.  Have you started to work out or worked out the details to kill yourself? NONE REPORTED 7.  Do you intend to carry out this plan? NONE REPORTED 8. On a scale of 1-5 with 1 being the least severe and 5 being the most severe answer the following questions place for intensity of ideation. ZERO 9. How many times have you had these thoughts? NONE REPORTED 10. When you have the thoughts how long to the last?  NONE REPORTED 11. Control ability.  Could you or can you stop thinking about killing herself or wanting to die if you want to?  YES 12. Are  there any things anyone or anything family religion pain of death that stop you from wanting to die or acting on thoughts of committing suicide?  FAMILY 13.   What sort of reason to do have to think about wanting to die or killing yourself? NONE REPORTED 14.Was it to end the pain or stop the way you are feeling in other words you could not go on living with his pain or how you are feeling or was not to get attention revenge or reaction from others?  Or both?  NONE REPORTED  [place] Suicide Risk assessment:  1. Do you wish to be dead? NONE REPORTED 2. Have you wished your dead or wished you could go to sleep and not wake up? NONE REPORTED 3.  Have you actually had thoughts of killing yourself?  NONE REPORTED 4.  Have you been thinking about how you might do this?  NONE REPORTED 5.  Have you had these thoughts and some intention of acting on them? NONE REPORTED 6.  Have you started to work out or worked out the details to kill yourself? NONE REPORTED 7.  Do you intend to carry out this plan? NONE REPORTED 8. On a scale of 1-5 with 1 being the least severe and 5 being the most severe answer the following questions place for intensity of ideation. ZERO 9. How many times have you had these thoughts? NONE REPORTED 10. When you have the thoughts how long to the last?  NONE REPORTED 11. Control ability.  Could you or can you stop thinking about killing herself or wanting to die if you want to?  YES 12. Are there any things anyone or anything family religion pain of death that stop you from wanting to die or acting on thoughts of committing suicide?  FAMILY 13.  What sort of reason to do have to think about wanting to die or killing yourself? NONE REPORTED 14.Was it to end the pain or stop the way you are feeling in other words you could not go on living with his pain or how you are feeling or was not to get attention revenge or reaction from others?  Or both?  NONE REPORTED    Follow-up Information     BEHAVIORAL HEALTH INTENSIVE CHEMICAL DEPENDENCY. Go on 02/02/2024.   Specialty: Behavioral Health Why: You have an appointment on 02/02/24 at 11:00 am for  substance abuse intensive outpatient therapy services. This program meets in person M, W, F from 9 am to 12 pm and runs for 8 to 12 weeks. Clients can also receive individual and family therapy and MAT. There is weekly drug testing. The program is abstinence based and AA, NA, [facility], etc. attendance is encouraged. For questions please call [phone]. Contact information: [address] [place]  [address] [phone]                Plan Of Care/Follow-up recommendations:  -discharge to CD-IOP for alcohol  abuse (patient declined any medications or IP rehab throughout admission)  Annaliz Aven, MD 02/01/2024, 7:09 PM

## 2024-02-01 NOTE — Group Note (Signed)
 Recreation Therapy Group Note   Group Topic:Leisure Education  Group Date: 02/01/2024 Start Time: 0932 End Time: 1018 Facilitators: Norah Fick-McCall, LRT,CTRS Location: [address] Dayroom   Group Topic: Leisure Education   Goal Area(s) Addresses:  Patient will successfully identify positive leisure and recreation activities.  Patient will acknowledge benefits of participation in healthy leisure activities post discharge.  Patient will actively work with peers toward a shared goal.   Behavioral Response: Engaged   Intervention: Competitive Group Game   Activity: Guess the [employer]. The game was broken up into 6 categories (Pop, [place], [place], Hip-Hop, R&B and Dance). The first team would use the spinner to land one of the music categories. Which ever category the spinner landed on, that group would read the lyrics and opposing team had to fill in the blank. If they answered correctly, they kept the card. The team with the most cards at the end, wins the game. Post-activity discussion reviewed benefits of positive recreation outlets: reducing stress, improving coping mechanisms, increasing self-esteem, and building larger support systems.   Education:  Teacher, English as a foreign language, Stress Management, Discharge Planning  Education Outcome: Acknowledges education/In group clarification offered/Needs additional education   Affect/Mood: Appropriate   Participation Level: Engaged   Participation Quality: Independent   Behavior: Appropriate   Speech/Thought Process: Focused   Insight: Good   Judgement: Good   Modes of Intervention: Competitive Play   Patient Response to Interventions:  Engaged   Education Outcome:  In group clarification offered    Clinical Observations/Individualized Feedback: Pt attended and participated in group.     Plan: Continue to engage patient in RT group sessions 2-3x/week.   Reid Nawrot-McCall, LRT,CTRS 02/01/2024 1:55 PM

## 2024-02-02 ENCOUNTER — Ambulatory Visit (INDEPENDENT_AMBULATORY_CARE_PROVIDER_SITE_OTHER): Admitting: Licensed Clinical Social Worker

## 2024-02-02 ENCOUNTER — Encounter (HOSPITAL_COMMUNITY): Payer: Self-pay

## 2024-02-02 ENCOUNTER — Encounter (HOSPITAL_COMMUNITY): Payer: Self-pay | Admitting: Licensed Clinical Social Worker

## 2024-02-02 DIAGNOSIS — Z5329 Procedure and treatment not carried out because of patient's decision for other reasons: Secondary | ICD-10-CM

## 2024-02-02 NOTE — Progress Notes (Signed)
 Pt discharged to lobby. Pt was stable and appreciative at that time. All papers and prescriptions were given and valuables returned. Verbal understanding expressed. Denies SI/HI and A/VH. Pt given opportunity to express concerns and ask questions.

## 2024-02-02 NOTE — Progress Notes (Addendum)
 Discharge SRA and discharge summary were completed by provider who evaluated patient yesterday. Upon assessment this morning patient continues to present reporting mood stability denying any depressed mood or anxiety, continues to deny any symptoms consistent with mania or hypomania, reports good sleep and appetite, denies craving to alcohol .  He continues with limited insight to severity of his drinking problem and need for inpatient treatment but in agreement with discharge plan today to go directly to evaluation for SA IOP and also compliant with recommendation to comply with SA IOP 3 times weekly.  Discussed with patient recommendation to abstain completely from alcohol  use after discharge.  He continues to minimize depression symptoms and refuses to start antidepressant.  Upon evaluation today and also this hospitalization he has been denying any passive or active SI intention or plan, continues to deny HI or AVH.  He is able to discuss coping skills with stressors as well as crisis plan if SI or HI arises after discharge.  He reports plan to go stay with his friend after discharge yet he continues to refuse to provide name or phone number.  Given today's evaluation patient does not meet criteria for IVC or continuing hospitalization, will be discharged as noted.

## 2024-02-02 NOTE — Plan of Care (Signed)

## 2024-02-02 NOTE — Group Note (Signed)
 Date:  02/02/2024 Time:  9:17 AM  Group Topic/Focus:  Goals Group:   The focus of this group is to help patients establish daily goals to achieve during treatment and discuss how the patient can incorporate goal setting into their daily lives to aide in recovery.    Participation Level:  Active  Participation Quality:  Appropriate  Affect:  Appropriate  Cognitive:  Appropriate  Insight: Good  Engagement in Group:  Engaged  Modes of Intervention:  Orientation  Additional Comments:    Violette Grief 02/02/2024, 9:17 AM

## 2024-02-02 NOTE — Progress Notes (Deleted)
 THERAPIST PROGRESS NOTE  Session Time: ***  Type of Therapy: Individual   Therapist Response/Interventions: {CHL AMB BH Type of Intervention:21022753}  Treatment Goals addressed: ***  Summary: ***  Progress Towards Goals: {Progress Towards Goals:21014066}  Suicidal/Homicidal:   Plan: Return again in *** weeks.  Diagnosis:   Collaboration of Care: {BH OP Collaboration of Care:21014065}  Patient/Guardian was advised Release of Information must be obtained prior to any record release in order to collaborate their care with an outside provider. Patient/Guardian was advised if they have not already done so to contact the registration department to sign all necessary forms in order for us  to release information regarding their care.   Consent: Patient/Guardian gives verbal consent for treatment and assignment of benefits for services provided during this visit. Patient/Guardian expressed understanding and agreed to proceed.   Melynda Stagger, MA, LCSW, LCMHC, LCAS

## 2024-02-02 NOTE — Progress Notes (Signed)
  [facility] Adult Case Management Discharge Plan :  Will you be returning to the same living situation after discharge:  No. Pt discharging to CDIOP appt, reports will then get Baby Bolt to friends house  At discharge, do you have transportation home?: Yes,  CSW arranged safe transport for 1030  Do you have the ability to pay for your medications: Yes,  pt has active health care insurance coverage  Release of information consent forms completed and in the chart;  Patient's signature needed at discharge.  Patient to Follow up at:  Follow-up Information     BEHAVIORAL HEALTH INTENSIVE CHEMICAL DEPENDENCY. Go on 02/02/2024.   Specialty: Behavioral Health Why: You have an appointment on 02/02/24 at 11:00 am for substance abuse intensive outpatient therapy services. This program meets in person M, W, F from 9 am to 12 pm and runs for 8 to 12 weeks. Clients can also receive individual and family therapy and MAT. There is weekly drug testing. The program is abstinence based and AA, NA, Smart Recovery, etc. attendance is encouraged. For questions please call [phone]. Contact information: [address] [place]  [address] [phone]                Next level of care provider has access to [facility] Link:yes  Safety Planning and Suicide Prevention discussed: Yes,  father, Abimelec Grochowski [phone]     Has patient been referred to the Quitline?: Patient refused referral for treatment  Patient has been referred for addiction treatment: Pt discharging to CD IOP appt at 1100AM  Vonzell Guerin, LCSWA 02/02/2024, 9:05 AM

## 2024-02-02 NOTE — Transportation (Signed)
 02/02/2024  Kyle Webb DOB: 12/04/1995 MRN: [id number]   RIDER WAIVER AND RELEASE OF LIABILITY  For the purposes of helping with transportation needs, [facility] partners with outside transportation providers (taxi companies, [place], Catering manager.) to give [facility] patients or other approved people the choice of on-demand rides [employer]") to our buildings for non-emergency visits.  By using [employer], I, the person signing this document, on behalf of myself and/or any legal minors (in my care using the [employer]), agree:  Science writer given to me are supplied by independent, outside transportation providers who do not work for, or have any affiliation with, [employer]. [facility] is not a transportation company. [facility] has no control over the quality or safety of the rides I get using [employer]. [place] has no control over whether any outside ride will happen on time or not. [facility] gives no guarantee on the reliability, quality, safety, or availability on any rides, or that no mistakes will happen. I know and accept that traveling by vehicle (car, truck, SVU, Carloyn Chi, bus, taxi, etc.) has risks of serious injuries such as disability, being paralyzed, and death. I know and agree the risk of using [employer] is mine alone, and not [employer]. Transport Services are provided "as is" and as are available. The transportation providers are in charge for all inspections and care of the vehicles used to provide these rides. I agree not to take legal action against [facility], its agents, employees, officers, directors, representatives, insurers, attorneys, assigns, successors, subsidiaries, and affiliates at any time for any reasons related directly or indirectly to using [employer]. I also agree not to take legal action against [facility] or its affiliates for any injury, death, or damage to property caused by or related to using  [employer]. I have read this Waiver and Release of Liability, and I understand the terms used in it and their legal meaning. This Waiver is freely and voluntarily given with the understanding that my right (or any legal minors) to legal action against  relating to [employer] is knowingly given up to use these services.   I attest that I read the Ride Waiver and Release of Liability to Kyle Webb, gave Mr. Lack the opportunity to ask questions and answered the questions asked (if any). I affirm that Kyle Webb then provided consent for assistance with transportation.

## 2024-02-03 ENCOUNTER — Ambulatory Visit (HOSPITAL_COMMUNITY)

## 2024-02-03 NOTE — Progress Notes (Signed)
 Kyle Webb presented for his appointment and told the Receptionist that he needed to use the restroom; however, never returned apparently eloping.  The therapist sends Kyle Webb no show correspondence via MyChart.  Melynda Stagger, MA, LCSW, [facility], LCAS 02/03/2024

## 2024-02-05 ENCOUNTER — Other Ambulatory Visit (HOSPITAL_COMMUNITY)
Admission: EM | Admit: 2024-02-05 | Discharge: 2024-02-10 | Disposition: A | Discharge status: Home / Self Care | Attending: Psychiatry | Admitting: Psychiatry

## 2024-02-05 ENCOUNTER — Other Ambulatory Visit: Payer: Self-pay

## 2024-02-05 ENCOUNTER — Ambulatory Visit (HOSPITAL_COMMUNITY): Admission: EM | Admit: 2024-02-05 | Discharge: 2024-02-05 | Attending: Psychiatry | Admitting: Psychiatry

## 2024-02-05 DIAGNOSIS — F10230 Alcohol dependence with withdrawal, uncomplicated: Secondary | ICD-10-CM | POA: Insufficient documentation

## 2024-02-05 DIAGNOSIS — F331 Major depressive disorder, recurrent, moderate: Secondary | ICD-10-CM

## 2024-02-05 DIAGNOSIS — F339 Major depressive disorder, recurrent, unspecified: Secondary | ICD-10-CM | POA: Diagnosis not present

## 2024-02-05 MED ORDER — ACETAMINOPHEN 325 MG PO TABS: 650.0000 mg | ORAL_TABLET | Freq: Four times a day (QID) | ORAL | Status: DC | PRN

## 2024-02-05 MED ORDER — HYDROXYZINE HCL 25 MG PO TABS: 25.0000 mg | ORAL_TABLET | Freq: Three times a day (TID) | ORAL | Status: DC | PRN

## 2024-02-05 MED ORDER — ALUM & MAG HYDROXIDE-SIMETH 200-200-20 MG/5ML PO SUSP: 30.0000 mL | ORAL | Status: DC | PRN

## 2024-02-05 MED ORDER — HALOPERIDOL 5 MG PO TABS: 5.0000 mg | ORAL_TABLET | Freq: Three times a day (TID) | ORAL | Status: DC | PRN

## 2024-02-05 MED ORDER — ARIPIPRAZOLE 5 MG PO TABS
5.0000 mg | ORAL_TABLET | Freq: Every day | ORAL | Status: DC
  Administered 2024-02-06 – 2024-02-10 (×6): 5 mg via ORAL
  Filled 2024-02-05 (×6): qty 1

## 2024-02-05 MED ORDER — THIAMINE MONONITRATE 100 MG PO TABS
100.0000 mg | ORAL_TABLET | Freq: Every day | ORAL | Status: DC
  Administered 2024-02-06 – 2024-02-10 (×5): 100 mg via ORAL
  Filled 2024-02-05 (×5): qty 1

## 2024-02-05 MED ORDER — TRAZODONE HCL 50 MG PO TABS
50.0000 mg | ORAL_TABLET | Freq: Every evening | ORAL | Status: DC | PRN
  Administered 2024-02-08 – 2024-02-09 (×2): 50 mg via ORAL
  Filled 2024-02-05 (×3): qty 1

## 2024-02-05 MED ORDER — OLANZAPINE 10 MG IM SOLR: 5.0000 mg | Freq: Three times a day (TID) | INTRAMUSCULAR | Status: DC | PRN

## 2024-02-05 MED ORDER — OLANZAPINE 10 MG IM SOLR: 10.0000 mg | Freq: Three times a day (TID) | INTRAMUSCULAR | Status: DC | PRN

## 2024-02-05 MED ORDER — DIPHENHYDRAMINE HCL 50 MG PO CAPS: 50.0000 mg | ORAL_CAPSULE | Freq: Three times a day (TID) | ORAL | Status: DC | PRN

## 2024-02-05 MED ORDER — MAGNESIUM HYDROXIDE 400 MG/5ML PO SUSP: 30.0000 mL | Freq: Every day | ORAL | Status: DC | PRN

## 2024-02-05 NOTE — ED Provider Notes (Signed)
 Facility Based Crisis Admission H&P  Date: 02/05/24 Patient Name: Kyle Webb MRN: [id number] Chief Complaint: " Not feeling like myself"  Diagnoses:  Final diagnoses:  Moderate episode of recurrent major depressive disorder (HCC)    HPI: Kyle Webb 28 y.o., male patient presented to [facility] as a walk in voluntarily unaccompanied with complaints of increasing depression.  Kyle Webb, 28 y.o., male patient seen face to face by this provider, and chart reviewed on 02/05/24.  On evaluation NEILAN Webb reports he has been experiencing increased anxiety and depression. The patient states that he "just doesn't feel right." And he has been going through a deepening depressive state since his father enforced a restraining order to bar him from the home. The patient states he was recently discharged from [facility] and that he had refused outpatient treatment and medication. The patient states that he is already drinking heavily again. The patient states that although he initially refused help with treatment  he would like help with substance abuse and medication management. The patient will be admitted to [facility] to facilitate medication management and residential treatment.    PHQ 2-9:  Flowsheet Row Office Visit from 03/03/2016 in [facility] Office Visit from 03/14/2015 in [facility]  Thoughts that you would be better off dead, or of hurting yourself in some way Not at all Not at all  PHQ-9 Total Score 1 0       Flowsheet Row ED from 02/05/2024 in [facility] (Discharged) [facility] from [facility]  C-SSRS RISK CATEGORY Moderate Risk High Risk High Risk         Total Time spent with patient: 30 minutes  Musculoskeletal  Strength & Muscle Tone: within normal limits Gait & Station:  normal Patient leans: N/A  Psychiatric Specialty Exam  Presentation General Appearance:  Disheveled  Eye Contact: Fair  Speech: Clear and Coherent  Speech Volume: Normal  Handedness: Right   Mood and Affect  Mood: Depressed  Affect: Congruent   Thought Process  Thought Processes: Goal Directed  Descriptions of Associations:Intact  Orientation:Full (Time, Place and Person)  Thought Content:Logical  Diagnosis of Schizophrenia or Schizoaffective disorder in past: No   Hallucinations:Hallucinations: None  Ideas of Reference:None  Suicidal Thoughts:Suicidal Thoughts: No  Homicidal Thoughts:Homicidal Thoughts: No   Sensorium  Memory: Immediate Fair  Judgment: Fair  Insight: Fair   Art therapist  Concentration: Fair  Attention Span: Fair  Recall: [employer] of Knowledge: Fair  Language: Fair   Psychomotor Activity  Psychomotor Activity: Psychomotor Activity: Normal   Assets  Assets: Communication Skills; Desire for Improvement   Sleep  Sleep: Sleep: Fair   Nutritional Assessment (For OBS and FBC admissions only) Has the patient had a weight loss or gain of 10 pounds or more in the last 3 months?: No Has the patient had a decrease in food intake/or appetite?: No Does the patient have dental problems?: No Does the patient have eating habits or behaviors that may be indicators of an eating disorder including binging or inducing vomiting?: No Has the patient recently lost weight without trying?: 0 Has the patient been eating poorly because of a decreased appetite?: 0 Malnutrition Screening Tool Score: 0    Physical Exam HENT:     Head: Normocephalic.     Mouth/Throat:     Pharynx: Oropharynx is clear.  Eyes:     Extraocular Movements: Extraocular movements intact.  Pulmonary:     Effort: Pulmonary effort is normal.  Musculoskeletal:        General: Normal range of motion.     Cervical back: Normal range of  motion.  Neurological:     General: No focal deficit present.     Mental Status: He is alert.    Review of Systems  Constitutional: Negative.   HENT: Negative.    Eyes: Negative.   Respiratory: Negative.    Cardiovascular: Negative.   Gastrointestinal: Negative.   Genitourinary: Negative.   Musculoskeletal: Negative.   Skin: Negative.   Neurological: Negative.   Psychiatric/Behavioral:  Positive for depression and substance abuse.     Blood pressure 130/88, pulse 76, temperature 97.9 F (36.6 C), temperature source Oral, resp. rate 18, SpO2 100%. There is no height or weight on file to calculate BMI.  Past Psychiatric History: MDD   Is the patient at risk to self? No  Has the patient been a risk to self in the past 6 months? Yes .    Has the patient been a risk to self within the distant past? No   Is the patient a risk to others? No   Has the patient been a risk to others in the past 6 months? No   Has the patient been a risk to others within the distant past? No   Past Medical History: No pertinent medical history  Family History: Patients father has history of depression Social History: Patient is homeless  Last Labs:  Admission on 01/06/2024, Discharged on 01/19/2024  Component Date Value Ref Range Status   Cholesterol 01/08/2024 246 (H)  0 - 200 mg/dL Final   Triglycerides 29/52/8413 90  <150 mg/dL Final   HDL 24/40/1027 112  >40 mg/dL Final   Total CHOL/HDL Ratio 01/08/2024 2.2  RATIO Final   VLDL 01/08/2024 18  0 - 40 mg/dL Final   LDL Cholesterol 01/08/2024 116 (H)  0 - 99 mg/dL Final   Comment:        Total Cholesterol/HDL:CHD Risk Coronary Heart Disease Risk Table                     Men   Women  1/2 Average Risk   3.4   3.3  Average Risk       5.0   4.4  2 X Average Risk   9.6   7.1  3 X Average Risk  23.4   11.0        Use the calculated Patient Ratio above and the CHD Risk Table to determine the patient's CHD Risk.        ATP III CLASSIFICATION  (LDL):  <100     mg/dL   Optimal  253-664  mg/dL   Near or Above                    Optimal  130-159  mg/dL   Borderline  403-474  mg/dL   High  >259     mg/dL   Very High Performed at [facility], [address], [place], [place] [address]    Hgb A1c MFr Bld 01/08/2024 4.3 (L)  4.8 - 5.6 % Final   Comment: (NOTE) Pre diabetes:          5.7%-6.4%  Diabetes:              >6.4%  Glycemic control for   <  7.0% adults with diabetes    Mean Plasma Glucose 01/08/2024 76.71  mg/dL Final   Performed at [facility], [address], [place], [place] [address]   TSH 01/08/2024 2.734  0.350 - 4.500 uIU/mL Final   Comment: Performed by a 3rd Generation assay with a functional sensitivity of <=0.01 uIU/mL. Performed at [facility], [address] [place], [place] [address]    Sodium 01/15/2024 138  135 - 145 mmol/L Final   Potassium 01/15/2024 3.9  3.5 - 5.1 mmol/L Final   Chloride 01/15/2024 104  98 - 111 mmol/L Final   CO2 01/15/2024 26  22 - 32 mmol/L Final   Glucose, Bld 01/15/2024 88  70 - 99 mg/dL Final   Glucose reference range applies only to samples taken after fasting for at least 8 hours.   BUN 01/15/2024 14  6 - 20 mg/dL Final   Creatinine, Ser 01/15/2024 0.48 (L)  0.61 - 1.24 mg/dL Final   Calcium  01/15/2024 9.0  8.9 - 10.3 mg/dL Final   Total Protein 91/47/8295 7.1  6.5 - 8.1 g/dL Final   Albumin 62/13/0865 4.1  3.5 - 5.0 g/dL Final   AST 78/46/9629 18  15 - 41 U/L Final   ALT 01/15/2024 13  0 - 44 U/L Final   Alkaline Phosphatase 01/15/2024 35 (L)  38 - 126 U/L Final   Total Bilirubin 01/15/2024 0.6  0.0 - 1.2 mg/dL Final   GFR, Estimated 01/15/2024 >60  >60 mL/min Final   Comment: (NOTE) Calculated using the CKD-EPI Creatinine Equation (2021)    Anion gap 01/15/2024 8  5 - 15 Final   Performed at [facility], [address], [place], [place] [address]   Hepatitis B Surface Ag 01/15/2024 NON REACTIVE  NON  REACTIVE Final   HCV Ab 01/15/2024 NON REACTIVE  NON REACTIVE Final   Comment: (NOTE) Nonreactive HCV antibody screen is consistent with no HCV infections,  unless recent infection is suspected or other evidence exists to indicate HCV infection.     Hep A IgM 01/15/2024 NON REACTIVE  NON REACTIVE Final   Hep B C IgM 01/15/2024 NON REACTIVE  NON REACTIVE Final   Performed at [facility], [address] [place], [place] [address]  Admission on 01/05/2024, Discharged on 01/06/2024  Component Date Value Ref Range Status   Sodium 01/05/2024 141  135 - 145 mmol/L Final   Potassium 01/05/2024 3.5  3.5 - 5.1 mmol/L Final   Chloride 01/05/2024 104  98 - 111 mmol/L Final   CO2 01/05/2024 23  22 - 32 mmol/L Final   Glucose, Bld 01/05/2024 96  70 - 99 mg/dL Final   Glucose reference range applies only to samples taken after fasting for at least 8 hours.   BUN 01/05/2024 7  6 - 20 mg/dL Final   Creatinine, Ser 01/05/2024 0.46 (L)  0.61 - 1.24 mg/dL Final   Calcium  01/05/2024 8.4 (L)  8.9 - 10.3 mg/dL Final   Total Protein 07/05/2535 8.2 (H)  6.5 - 8.1 g/dL Final   Albumin 64/40/3474 4.6  3.5 - 5.0 g/dL Final   AST 25/95/6387 31  15 - 41 U/L Final   ALT 01/05/2024 16  0 - 44 U/L Final   Alkaline Phosphatase 01/05/2024 47  38 - 126 U/L Final   Total Bilirubin 01/05/2024 0.4  0.0 - 1.2 mg/dL Final   GFR, Estimated 01/05/2024 >60  >60 mL/min Final   Comment: (NOTE) Calculated  using the CKD-EPI Creatinine Equation (2021)    Anion gap 01/05/2024 14  5 - 15 Final   Performed at [facility], [address] [place], [place] [address]   Alcohol , Ethyl (B) 01/05/2024 427 (HH)  <15 mg/dL Final   Comment: CRITICAL RESULT CALLED TO, READ BACK BY AND VERIFIED WITH Norine Beau, RN 01/05/24 1624 J. COLE Please note change in reference range. (NOTE) For medical purposes only. Performed at [facility], [address] [place], [place] [address]    Opiates  01/05/2024 NONE DETECTED  NONE DETECTED Final   Cocaine 01/05/2024 NONE DETECTED  NONE DETECTED Final   Benzodiazepines 01/05/2024 NONE DETECTED  NONE DETECTED Final   Amphetamines 01/05/2024 NONE DETECTED  NONE DETECTED Final   Tetrahydrocannabinol 01/05/2024 POSITIVE (A)  NONE DETECTED Final   Barbiturates 01/05/2024 NONE DETECTED  NONE DETECTED Final   Comment: (NOTE) DRUG SCREEN FOR MEDICAL PURPOSES ONLY.  IF CONFIRMATION IS NEEDED FOR ANY PURPOSE, NOTIFY LAB WITHIN 5 DAYS.  LOWEST DETECTABLE LIMITS FOR URINE DRUG SCREEN Drug Class                     Cutoff (ng/mL) Amphetamine  and metabolites    1000 Barbiturate and metabolites    200 Benzodiazepine                 200 Opiates and metabolites        300 Cocaine and metabolites        300 THC                            50 Performed at [facility], [address], [place], [place] [address]    WBC 01/05/2024 6.1  4.0 - 10.5 K/uL Final   RBC 01/05/2024 4.61  4.22 - 5.81 MIL/uL Final   Hemoglobin 01/05/2024 15.6  13.0 - 17.0 g/dL Final   HCT 91/47/8295 45.2  39.0 - 52.0 % Final   MCV 01/05/2024 98.0  80.0 - 100.0 fL Final   MCH 01/05/2024 33.8  26.0 - 34.0 pg Final   MCHC 01/05/2024 34.5  30.0 - 36.0 g/dL Final   RDW 62/13/0865 11.6  11.5 - 15.5 % Final   Platelets 01/05/2024 425 (H)  150 - 400 K/uL Final   nRBC 01/05/2024 0.0  0.0 - 0.2 % Final   Neutrophils Relative % 01/05/2024 54  % Final   Neutro Abs 01/05/2024 3.3  1.7 - 7.7 K/uL Final   Lymphocytes Relative 01/05/2024 40  % Final   Lymphs Abs 01/05/2024 2.4  0.7 - 4.0 K/uL Final   Monocytes Relative 01/05/2024 4  % Final   Monocytes Absolute 01/05/2024 0.3  0.1 - 1.0 K/uL Final   Eosinophils Relative 01/05/2024 1  % Final   Eosinophils Absolute 01/05/2024 0.0  0.0 - 0.5 K/uL Final   Basophils Relative 01/05/2024 1  % Final   Basophils Absolute 01/05/2024 0.1  0.0 - 0.1 K/uL Final   Immature Granulocytes 01/05/2024 0  % Final   Abs Immature  Granulocytes 01/05/2024 0.02  0.00 - 0.07 K/uL Final   Performed at [facility], [address] [place], [place] [address]   Acetaminophen  (Tylenol ), Serum 01/05/2024 <10 (L)  10 - 30 ug/mL Final   Comment: (NOTE) Therapeutic concentrations vary significantly. A range of 10-30 ug/mL  may be an effective concentration for many patients. However, some  are best treated  at concentrations outside of this range. Acetaminophen  concentrations >150 ug/mL at 4 hours after ingestion  and >50 ug/mL at 12 hours after ingestion are often associated with  toxic reactions.  Performed at [facility], [address], [place], [place] [address]    Salicylate Lvl 01/05/2024 <7.0 (L)  7.0 - 30.0 mg/dL Final   Performed at [facility], [address] [place], [place] [address]    Allergies: Patient has no known allergies.  Medications:  Facility Ordered Medications  Medication   acetaminophen  (TYLENOL ) tablet 650 mg   alum & mag hydroxide-simeth (MAALOX/MYLANTA) 200-200-20 MG/5ML suspension 30 mL   magnesium  hydroxide (MILK OF MAGNESIA) suspension 30 mL   haloperidol  (HALDOL ) tablet 5 mg   And   diphenhydrAMINE  (BENADRYL ) capsule 50 mg   OLANZapine (ZYPREXA) injection 5 mg   OLANZapine (ZYPREXA) injection 10 mg   traZODone  (DESYREL ) tablet 50 mg   hydrOXYzine  (ATARAX ) tablet 25 mg   PTA Medications  Medication Sig   Multiple Vitamin (MULTIVITAMIN WITH MINERALS) TABS tablet Take 1 tablet by mouth daily.   thiamine  (VITAMIN B-1) 100 MG tablet Take 1 tablet (100 mg total) by mouth daily.    Long Term Goals: Improvement in symptoms so as ready for discharge  Short Term Goals: Patient will verbalize feelings in meetings with treatment team members., Patient will attend at least of 50% of the groups daily., Pt will complete the PHQ9 on admission, day 3 and discharge., Patient will participate in completing the [place] Suicide Severity  Rating Scale, Patient will score a low risk of violence for 24 hours prior to discharge, and Patient will take medications as prescribed daily.  Medical Decision Making  Patient suffers from increasing depression and substance abuse. The patient is seeking a residential treatment facility. Patient recommendation is for [facility].    Recommendations  Based on my evaluation the patient does not appear to have an emergency medical condition.  Dastan Krider, NP 02/05/24  11:23 PM

## 2024-02-05 NOTE — Progress Notes (Signed)
   02/05/24 1936  [facility] Triage Screening (Walk-ins at [facility] only)  How Did You Hear About Us ? Self  What Is the Reason for Your Visit/Call Today? Pt presents to [facility] as a voluntary walk-in, unaccompanied with complaint of worsening depression and requesting medication. Pt reports suffering from depression for about a year due to not being in his children's lives and more recently "just feeling different". Pt reports that he struggles to get through a work day and inability to function at times. Pt also reports falling a few days ago and reports hitting his head, but believed he did not need medical treatment at the time. Pt reports drinking alcohol  a few hours ago (4-Loko). Pt has history of SI, alcohol  intoxication and recent impatient hospitalization at [facility] earlier this month. Pt currently denies SI,HI,AVH.  How Long Has This Been Causing You Problems? > than 6 months  Have You Recently Had Any Thoughts About Hurting Yourself? No  Are You Planning to Commit Suicide/Harm Yourself At This time? No  Have you Recently Had Thoughts About Hurting Someone [name]? No  Are You Planning To Harm Someone At This Time? No  Physical Abuse Denies  Verbal Abuse Denies  Sexual Abuse Denies  Exploitation of patient/patient's resources Denies  Self-Neglect Denies  Are you currently experiencing any auditory, visual or other hallucinations? No  Have You Used Any Alcohol  or Drugs in the Past 24 Hours? Yes  What Did You Use and How Much? 4-Loko (1)  Do you have any current medical co-morbidities that require immediate attention? No  Clinician description of patient physical appearance/behavior: Cooperative, calm  What Do You Feel Would Help You the Most Today? Medication(s);Treatment for Depression or other mood problem  If access to [facility] was not available, would you have sought care in the Emergency Department? Yes  Determination of Need Routine (7 days)  Options For Referral Other: Comment;Outpatient  Therapy;Medication Management
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
## 2024-02-06 LAB — CBC WITH DIFFERENTIAL/PLATELET
Abs Immature Granulocytes: 0.02 10*3/uL (ref 0.00–0.07)
Basophils Absolute: 0 10*3/uL (ref 0.0–0.1)
Basophils Relative: 1 %
Eosinophils Absolute: 0.1 10*3/uL (ref 0.0–0.5)
Eosinophils Relative: 1 %
HCT: 41.9 % (ref 39.0–52.0)
Hemoglobin: 14.7 g/dL (ref 13.0–17.0)
Immature Granulocytes: 0 %
Lymphocytes Relative: 47 %
Lymphs Abs: 3.2 10*3/uL (ref 0.7–4.0)
MCH: 33.4 pg (ref 26.0–34.0)
MCHC: 35.1 g/dL (ref 30.0–36.0)
MCV: 95.2 fL (ref 80.0–100.0)
Monocytes Absolute: 0.5 10*3/uL (ref 0.1–1.0)
Monocytes Relative: 8 %
Neutro Abs: 2.8 10*3/uL (ref 1.7–7.7)
Neutrophils Relative %: 43 %
Platelets: 319 10*3/uL (ref 150–400)
RBC: 4.4 MIL/uL (ref 4.22–5.81)
RDW: 10.9 % — ABNORMAL LOW (ref 11.5–15.5)
WBC: 6.6 10*3/uL (ref 4.0–10.5)
nRBC: 0 % (ref 0.0–0.2)

## 2024-02-06 LAB — COMPREHENSIVE METABOLIC PANEL WITH GFR
ALT: 20 U/L (ref 0–44)
AST: 33 U/L (ref 15–41)
Albumin: 4.2 g/dL (ref 3.5–5.0)
Alkaline Phosphatase: 45 U/L (ref 38–126)
Anion gap: 15 (ref 5–15)
BUN: 7 mg/dL (ref 6–20)
CO2: 23 mmol/L (ref 22–32)
Calcium: 9 mg/dL (ref 8.9–10.3)
Chloride: 102 mmol/L (ref 98–111)
Creatinine, Ser: 0.55 mg/dL — ABNORMAL LOW (ref 0.61–1.24)
GFR, Estimated: >60 mL/min (ref >60)
Glucose, Bld: 80 mg/dL (ref 70–99)
Potassium: 3.9 mmol/L (ref 3.5–5.1)
Sodium: 140 mmol/L (ref 135–145)
Total Bilirubin: 0.5 mg/dL (ref 0.0–1.2)
Total Protein: 7.2 g/dL (ref 6.5–8.1)

## 2024-02-06 LAB — ETHANOL: Alcohol, Ethyl (B): 147 mg/dL — ABNORMAL HIGH (ref <15)

## 2024-02-06 NOTE — ED Notes (Signed)
 Patient A&Ox4. Denies intent to harm self/others when asked. Denies A/VH. Patient denies any physical complaints when asked. Denies withdrawal sx from ETOH. No acute distress noted. Support and encouragement provided. Routine safety checks conducted according to facility protocol. Encouraged patient to notify staff if thoughts of harm toward self or others arise. Patient verbalize understanding and agreement. Will continue to monitor for safety.

## 2024-02-06 NOTE — ED Notes (Signed)
 Pt in dayroom watching television. Respirations equal and unlabored, skin warm and dry. Pt was offered support and encouragement. Pt receptive to treatment and safety maintained on unit. Routine safety checks maintained/ongoing.

## 2024-02-06 NOTE — ED Notes (Signed)
 Pt in bed w/eyes closed resting quietly. Respirations equal and unlabored. No signs or symptoms of distress. Routine safety checks maintained/ongoing.

## 2024-02-06 NOTE — ED Notes (Signed)
 Pt A&O x 4. Pt denies SI/HI/AVH. Pt oriented to the unit. Pt given meal to eat. Pt denies physical complaints. Support and encouragement provided. Q15 min safety checks in place. Pt encouraged to notify staff if thoughts of hurting themselves or others arise. Pt verbalized understanding and agreement. Pt is currently safe on the unit.

## 2024-02-06 NOTE — ED Notes (Signed)
 Patient A&Ox4. Denies intent to harm self/others when asked. Denies A/VH. Patient denies any physical complaints when asked. No acute distress noted. Support and encouragement provided. Routine safety checks conducted according to facility protocol. Encouraged patient to notify staff if thoughts of harm toward self or others arise. Patient verbalize understanding and agreement. Will continue to monitor for safety.

## 2024-02-06 NOTE — ED Notes (Signed)
 Pt sleeping in no acute distress. RR even and unlabored. Environment secured. Will continue to monitor for safety.

## 2024-02-06 NOTE — ED Notes (Signed)
 Patient is sleeping. Respirations equal and unlabored, skin warm and dry. No change in assessment or acuity. Routine safety checks conducted according to facility protocol. Will continue to monitor for safety.
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
## 2024-02-06 NOTE — ED Provider Notes (Addendum)
 Behavioral Health Progress Note  Date and Time: 02/06/2024 1:45 PM Name: Kyle Webb MRN:  [id number]  Subjective:   Kyle Webb is a 28 yr old male who presented to [facility] on 5/30 with worsening depression in the setting of EtOH Abuse, he was admitted to [facility] on 5/30.  PPHx is significant for Depression, Anxiety, EtOH Abuse, ADHD, and 1 Prior Psychiatric Hospitalization [facility] 01/2024), and no history of Suicide Attempts or Self Injurious Behavior. He reports 1 withdrawal seizure due to Xanax abuse when 19.   He reports that he is not feeling to good this morning due to coming in so late and having withdrawal headaches.  He reports he does not think he is having any side effects to starting the Abilify .  Discussed if he had thought further about residential rehab and he reports that he is interested in it.  He reports his sleep is good.  He reports his appetite is good.  He reports no SI, HI, or AVH.  He reports no other concerns at present.   Diagnosis:  Final diagnoses:  None    Total Time spent with patient:  I personally spent 35 minutes on the unit in direct patient care. The direct patient care time included face-to-face time with the patient, reviewing the patient's chart, communicating with other professionals, and coordinating care. Greater than 50% of this time was spent in counseling or coordinating care with the patient regarding goals of hospitalization, psycho-education, and discharge planning needs.   Past Psychiatric History: Depression, Anxiety, EtOH Abuse, ADHD, and 1 Prior Psychiatric Hospitalization [facility] 01/2024), and no history of Suicide Attempts or Self Injurious Behavior. He reports 1 withdrawal seizure due to Xanax abuse when 19.  Past Medical History: Reports None Family History: Reports None Family Psychiatric  History: Kyle Webb- EtOH Abuse, Sober for 10 years Multiple Members both sides- EtOH Abuse No Known Suicides Social History: Unhoused.  Additional  Social History:                         Sleep: Good  Appetite:  Good  Current Medications:  Current Facility-Administered Medications  Medication Dose Route Frequency Provider Last Rate Last Admin   acetaminophen  (TYLENOL ) tablet 650 mg  650 mg Oral Q6H PRN McLauchlin, Angela, NP       alum & mag hydroxide-simeth (MAALOX/MYLANTA) 200-200-20 MG/5ML suspension 30 mL  30 mL Oral Q4H PRN McLauchlin, Angela, NP       ARIPiprazole  (ABILIFY ) tablet 5 mg  5 mg Oral Daily McLauchlin, Angela, NP   5 mg at 02/06/24 4098   haloperidol  (HALDOL ) tablet 5 mg  5 mg Oral TID PRN McLauchlin, Angela, NP       And   diphenhydrAMINE  (BENADRYL ) capsule 50 mg  50 mg Oral TID PRN McLauchlin, Angela, NP       hydrOXYzine  (ATARAX ) tablet 25 mg  25 mg Oral TID PRN McLauchlin, Angela, NP       magnesium  hydroxide (MILK OF MAGNESIA) suspension 30 mL  30 mL Oral Daily PRN McLauchlin, Angela, NP       OLANZapine  (ZYPREXA ) injection 10 mg  10 mg Intramuscular TID PRN McLauchlin, Angela, NP       OLANZapine  (ZYPREXA ) injection 5 mg  5 mg Intramuscular TID PRN McLauchlin, Angela, NP       thiamine  (VITAMIN B1) tablet 100 mg  100 mg Oral Daily McLauchlin, Angela, NP   100 mg at 02/06/24 0953   traZODone  (DESYREL ) tablet  50 mg  50 mg Oral QHS PRN McLauchlin, Angela, NP       No current outpatient medications on file.    Labs  Lab Results:  Admission on 02/05/2024  Component Date Value Ref Range Status   WBC 02/05/2024 6.6  4.0 - 10.5 K/uL Final   RBC 02/05/2024 4.40  4.22 - 5.81 MIL/uL Final   Hemoglobin 02/05/2024 14.7  13.0 - 17.0 g/dL Final   HCT 40/98/1191 41.9  39.0 - 52.0 % Final   MCV 02/05/2024 95.2  80.0 - 100.0 fL Final   MCH 02/05/2024 33.4  26.0 - 34.0 pg Final   MCHC 02/05/2024 35.1  30.0 - 36.0 g/dL Final   RDW 47/82/9562 10.9 (L)  11.5 - 15.5 % Final   Platelets 02/05/2024 319  150 - 400 K/uL Final   nRBC 02/05/2024 0.0  0.0 - 0.2 % Final   Neutrophils Relative % 02/05/2024 43  % Final    Neutro Abs 02/05/2024 2.8  1.7 - 7.7 K/uL Final   Lymphocytes Relative 02/05/2024 47  % Final   Lymphs Abs 02/05/2024 3.2  0.7 - 4.0 K/uL Final   Monocytes Relative 02/05/2024 8  % Final   Monocytes Absolute 02/05/2024 0.5  0.1 - 1.0 K/uL Final   Eosinophils Relative 02/05/2024 1  % Final   Eosinophils Absolute 02/05/2024 0.1  0.0 - 0.5 K/uL Final   Basophils Relative 02/05/2024 1  % Final   Basophils Absolute 02/05/2024 0.0  0.0 - 0.1 K/uL Final   Immature Granulocytes 02/05/2024 0  % Final   Abs Immature Granulocytes 02/05/2024 0.02  0.00 - 0.07 K/uL Final   Performed at [facility], [address] [place], [place] [address]   Sodium 02/05/2024 140  135 - 145 mmol/L Final   Potassium 02/05/2024 3.9  3.5 - 5.1 mmol/L Final   Chloride 02/05/2024 102  98 - 111 mmol/L Final   CO2 02/05/2024 23  22 - 32 mmol/L Final   Glucose, Bld 02/05/2024 80  70 - 99 mg/dL Final   Glucose reference range applies only to samples taken after fasting for at least 8 hours.   BUN 02/05/2024 7  6 - 20 mg/dL Final   Creatinine, Ser 02/05/2024 0.55 (L)  0.61 - 1.24 mg/dL Final   Calcium  02/05/2024 9.0  8.9 - 10.3 mg/dL Final   Total Protein 57/84/6962 7.2  6.5 - 8.1 g/dL Final   Albumin 95/28/4132 4.2  3.5 - 5.0 g/dL Final   AST 44/09/270 33  15 - 41 U/L Final   ALT 02/05/2024 20  0 - 44 U/L Final   Alkaline Phosphatase 02/05/2024 45  38 - 126 U/L Final   Total Bilirubin 02/05/2024 0.5  0.0 - 1.2 mg/dL Final   GFR, Estimated 02/05/2024 >60  >60 mL/min Final   Comment: (NOTE) Calculated using the CKD-EPI Creatinine Equation (2021)    Anion gap 02/05/2024 15  5 - 15 Final   Performed at [facility], [address] [place], [place] [address]   Alcohol , Ethyl (B) 02/05/2024 147 (H)  <15 mg/dL Final   Comment: (NOTE) For medical purposes only. Performed at [facility], [address], [place], [place] [address]   Admission on 01/06/2024, Discharged on 01/19/2024  Component Date  Value Ref Range Status   Cholesterol 01/08/2024 246 (H)  0 - 200 mg/dL Final   Triglycerides 42/59/5638 90  <150 mg/dL Final   HDL 75/64/3329 112  >40 mg/dL Final  Total CHOL/HDL Ratio 01/08/2024 2.2  RATIO Final   VLDL 01/08/2024 18  0 - 40 mg/dL Final   LDL Cholesterol 01/08/2024 116 (H)  0 - 99 mg/dL Final   Comment:        Total Cholesterol/HDL:CHD Risk Coronary Heart Disease Risk Table                     Men   Women  1/2 Average Risk   3.4   3.3  Average Risk       5.0   4.4  2 X Average Risk   9.6   7.1  3 X Average Risk  23.4   11.0        Use the calculated Patient Ratio above and the CHD Risk Table to determine the patient's CHD Risk.        ATP III CLASSIFICATION (LDL):  <100     mg/dL   Optimal  629-528  mg/dL   Near or Above                    Optimal  130-159  mg/dL   Borderline  413-244  mg/dL   High  >010     mg/dL   Very High Performed at [facility], [address], [place], [place] [address]    Hgb A1c MFr Bld 01/08/2024 4.3 (L)  4.8 - 5.6 % Final   Comment: (NOTE) Pre diabetes:          5.7%-6.4%  Diabetes:              >6.4%  Glycemic control for   <7.0% adults with diabetes    Mean Plasma Glucose 01/08/2024 76.71  mg/dL Final   Performed at [facility], [address], [place], [place] [address]   TSH 01/08/2024 2.734  0.350 - 4.500 uIU/mL Final   Comment: Performed by a 3rd Generation assay with a functional sensitivity of <=0.01 uIU/mL. Performed at [facility], [address] [place], [place] [address]    Sodium 01/15/2024 138  135 - 145 mmol/L Final   Potassium 01/15/2024 3.9  3.5 - 5.1 mmol/L Final   Chloride 01/15/2024 104  98 - 111 mmol/L Final   CO2 01/15/2024 26  22 - 32 mmol/L Final   Glucose, Bld 01/15/2024 88  70 - 99 mg/dL Final   Glucose reference range applies only to samples taken after fasting for at least 8 hours.   BUN 01/15/2024 14  6 - 20 mg/dL Final   Creatinine, Ser  01/15/2024 0.48 (L)  0.61 - 1.24 mg/dL Final   Calcium  01/15/2024 9.0  8.9 - 10.3 mg/dL Final   Total Protein 59/56/3875 7.1  6.5 - 8.1 g/dL Final   Albumin 64/33/2951 4.1  3.5 - 5.0 g/dL Final   AST 88/41/6606 18  15 - 41 U/L Final   ALT 01/15/2024 13  0 - 44 U/L Final   Alkaline Phosphatase 01/15/2024 35 (L)  38 - 126 U/L Final   Total Bilirubin 01/15/2024 0.6  0.0 - 1.2 mg/dL Final   GFR, Estimated 01/15/2024 >60  >60 mL/min Final   Comment: (NOTE) Calculated using the CKD-EPI Creatinine Equation (2021)    Anion gap 01/15/2024 8  5 - 15 Final   Performed at [facility], [address] [place], [place] [address]   Hepatitis B Surface Ag 01/15/2024 NON REACTIVE  NON REACTIVE Final   HCV Ab 01/15/2024  NON REACTIVE  NON REACTIVE Final   Comment: (NOTE) Nonreactive HCV antibody screen is consistent with no HCV infections,  unless recent infection is suspected or other evidence exists to indicate HCV infection.     Hep A IgM 01/15/2024 NON REACTIVE  NON REACTIVE Final   Hep B C IgM 01/15/2024 NON REACTIVE  NON REACTIVE Final   Performed at [facility], [address] [place], [place] [address]  Admission on 01/05/2024, Discharged on 01/06/2024  Component Date Value Ref Range Status   Sodium 01/05/2024 141  135 - 145 mmol/L Final   Potassium 01/05/2024 3.5  3.5 - 5.1 mmol/L Final   Chloride 01/05/2024 104  98 - 111 mmol/L Final   CO2 01/05/2024 23  22 - 32 mmol/L Final   Glucose, Bld 01/05/2024 96  70 - 99 mg/dL Final   Glucose reference range applies only to samples taken after fasting for at least 8 hours.   BUN 01/05/2024 7  6 - 20 mg/dL Final   Creatinine, Ser 01/05/2024 0.46 (L)  0.61 - 1.24 mg/dL Final   Calcium  01/05/2024 8.4 (L)  8.9 - 10.3 mg/dL Final   Total Protein 02/54/2706 8.2 (H)  6.5 - 8.1 g/dL Final   Albumin 23/76/2831 4.6  3.5 - 5.0 g/dL Final   AST 51/76/1607 31  15 - 41 U/L Final   ALT 01/05/2024 16  0 - 44 U/L Final   Alkaline  Phosphatase 01/05/2024 47  38 - 126 U/L Final   Total Bilirubin 01/05/2024 0.4  0.0 - 1.2 mg/dL Final   GFR, Estimated 01/05/2024 >60  >60 mL/min Final   Comment: (NOTE) Calculated using the CKD-EPI Creatinine Equation (2021)    Anion gap 01/05/2024 14  5 - 15 Final   Performed at [facility], [address] [place], [place] [address]   Alcohol , Ethyl (B) 01/05/2024 427 (HH)  <15 mg/dL Final   Comment: CRITICAL RESULT CALLED TO, READ BACK BY AND VERIFIED WITH Norine Beau, RN 01/05/24 1624 J. COLE Please note change in reference range. (NOTE) For medical purposes only. Performed at [facility], [address], [place], [place] [address]    Opiates 01/05/2024 NONE DETECTED  NONE DETECTED Final   Cocaine 01/05/2024 NONE DETECTED  NONE DETECTED Final   Benzodiazepines 01/05/2024 NONE DETECTED  NONE DETECTED Final   Amphetamines 01/05/2024 NONE DETECTED  NONE DETECTED Final   Tetrahydrocannabinol 01/05/2024 POSITIVE (A)  NONE DETECTED Final   Barbiturates 01/05/2024 NONE DETECTED  NONE DETECTED Final   Comment: (NOTE) DRUG SCREEN FOR MEDICAL PURPOSES ONLY.  IF CONFIRMATION IS NEEDED FOR ANY PURPOSE, NOTIFY LAB WITHIN 5 DAYS.  LOWEST DETECTABLE LIMITS FOR URINE DRUG SCREEN Drug Class                     Cutoff (ng/mL) Amphetamine  and metabolites    1000 Barbiturate and metabolites    200 Benzodiazepine                 200 Opiates and metabolites        300 Cocaine and metabolites        300 THC                            50 Performed at [facility], [address], [place], [place] [address]    WBC 01/05/2024 6.1  4.0 - 10.5 K/uL Final  RBC 01/05/2024 4.61  4.22 - 5.81 MIL/uL Final   Hemoglobin 01/05/2024 15.6  13.0 - 17.0 g/dL Final   HCT 96/29/5284 45.2  39.0 - 52.0 % Final   MCV 01/05/2024 98.0  80.0 - 100.0 fL Final   MCH 01/05/2024 33.8  26.0 - 34.0 pg Final   MCHC 01/05/2024 34.5  30.0 - 36.0 g/dL Final   RDW  13/24/4010 11.6  11.5 - 15.5 % Final   Platelets 01/05/2024 425 (H)  150 - 400 K/uL Final   nRBC 01/05/2024 0.0  0.0 - 0.2 % Final   Neutrophils Relative % 01/05/2024 54  % Final   Neutro Abs 01/05/2024 3.3  1.7 - 7.7 K/uL Final   Lymphocytes Relative 01/05/2024 40  % Final   Lymphs Abs 01/05/2024 2.4  0.7 - 4.0 K/uL Final   Monocytes Relative 01/05/2024 4  % Final   Monocytes Absolute 01/05/2024 0.3  0.1 - 1.0 K/uL Final   Eosinophils Relative 01/05/2024 1  % Final   Eosinophils Absolute 01/05/2024 0.0  0.0 - 0.5 K/uL Final   Basophils Relative 01/05/2024 1  % Final   Basophils Absolute 01/05/2024 0.1  0.0 - 0.1 K/uL Final   Immature Granulocytes 01/05/2024 0  % Final   Abs Immature Granulocytes 01/05/2024 0.02  0.00 - 0.07 K/uL Final   Performed at [facility], [address], [place], [place] [address]   Acetaminophen  (Tylenol ), Serum 01/05/2024 <10 (L)  10 - 30 ug/mL Final   Comment: (NOTE) Therapeutic concentrations vary significantly. A range of 10-30 ug/mL  may be an effective concentration for many patients. However, some  are best treated at concentrations outside of this range. Acetaminophen  concentrations >150 ug/mL at 4 hours after ingestion  and >50 ug/mL at 12 hours after ingestion are often associated with  toxic reactions.  Performed at [facility], [address], [place], [place] [address]    Salicylate Lvl 01/05/2024 <7.0 (L)  7.0 - 30.0 mg/dL Final   Performed at [facility], [address], [place], [place] [address]    Blood Alcohol  level:  Lab Results  Component Value Date   ETH 147 (H) 02/05/2024   ETH 427 (HH) 01/05/2024    Metabolic Disorder Labs: Lab Results  Component Value Date   HGBA1C 4.3 (L) 01/08/2024   MPG 76.71 01/08/2024   MPG 99.67 05/13/2021   No results found for: "PROLACTIN" Lab Results  Component Value Date   CHOL 246 (H) 01/08/2024   TRIG 90 01/08/2024   HDL 112  01/08/2024   CHOLHDL 2.2 01/08/2024   VLDL 18 01/08/2024   LDLCALC 116 (H) 01/08/2024   LDLCALC 111 (H) 05/21/2022    Therapeutic Lab Levels: No results found for: "LITHIUM" No results found for: "VALPROATE" No results found for: "CBMZ"  Physical Findings   AUDIT    Flowsheet Row [facility] Ad[facility] (Discharged) [facility] Office Visit from 05/21/2022 in [facility] [place]  Alcohol  Use Disorder Identification Test Final Score (AUDIT) 18 18 15       PHQ2-9    Flowsheet Row ED from 02/05/2024 in [facility] Office Visit from 05/21/2022 in [facility] [place] Office Visit from 03/03/2016 in [facility] Office Visit from 03/14/2015 in [facility]  PHQ-2 Total Score 3 0 0  0  PHQ-9 Total Score 15 -- 1 0      Flowsheet Row ED from 02/05/2024 in [facility] Most recent reading at 02/06/2024 12:41 AM ED from 02/05/2024 in [facility] Most recent reading at 02/05/2024  7:58 PM Admission (Discharged) [facility] Most recent reading at 01/19/2024  7:13 PM  C-SSRS RISK CATEGORY Moderate Risk Moderate Risk High Risk        Musculoskeletal  Strength & Muscle Tone: within normal limits Gait & Station: normal Patient leans: N/A  Psychiatric Specialty Exam  Presentation  General Appearance:  Casual  Eye Contact: Good  Speech: Clear and Coherent; Slow  Speech Volume: Normal  Handedness: Right   Mood and Affect  Mood: Depressed  Affect: Congruent   Thought Process  Thought Processes: Coherent  Descriptions of Associations:Intact  Orientation:Full (Time, Place and Person)  Thought Content:WDL; Logical   Diagnosis of Schizophrenia or Schizoaffective disorder in past: No    Hallucinations:Hallucinations: None  Ideas of Reference:None  Suicidal Thoughts:Suicidal Thoughts: No  Homicidal Thoughts:Homicidal Thoughts: No   Sensorium  Memory: Immediate Fair  Judgment: Fair  Insight: Fair   Art therapist  Concentration: Fair  Attention Span: Fair  Recall: [employer] of Knowledge: Fair  Language: Fair   Psychomotor Activity  Psychomotor Activity: Psychomotor Activity: Normal   Assets  Assets: Communication Skills; Resilience; Desire for Improvement   Sleep  Sleep: Sleep: Good   Nutritional Assessment (For OBS and FBC admissions only) Has the patient had a weight loss or gain of 10 pounds or more in the last 3 months?: No Has the patient had a decrease in food intake/or appetite?: No Does the patient have dental problems?: No Does the patient have eating habits or behaviors that may be indicators of an eating disorder including binging or inducing vomiting?: No Has the patient recently lost weight without trying?: 0 Has the patient been eating poorly because of a decreased appetite?: 0 Malnutrition Screening Tool Score: 0    Physical Exam  Physical Exam Vitals and nursing note reviewed.  Constitutional:      General: He is not in acute distress.    Appearance: Normal appearance. He is normal weight. He is not ill-appearing or toxic-appearing.  HENT:     Head: Normocephalic and atraumatic.  Pulmonary:     Effort: Pulmonary effort is normal.  Musculoskeletal:        General: Normal range of motion.  Neurological:     General: No focal deficit present.     Mental Status: He is alert.    Review of Systems  Respiratory:  Negative for cough and shortness of breath.   Cardiovascular:  Negative for chest pain.  Gastrointestinal:  Negative for abdominal pain, constipation, diarrhea, nausea and vomiting.  Neurological:  Negative for dizziness,  weakness and headaches.  Psychiatric/Behavioral:  Negative for depression, hallucinations and suicidal ideas. The patient is not nervous/anxious.    Blood pressure 109/72, pulse 87, temperature 98.4 F (36.9 C), temperature source Oral, resp. rate 18, SpO2 97%. There is no height or weight on file to calculate BMI.  Treatment Plan Summary: Daily contact with patient to assess and evaluate symptoms and progress in treatment and Medication management  Dalbert Stillings is a 28 yr old male who presented to [facility] on 5/30 with worsening depression in the setting of EtOH Abuse, he was admitted to [facility] on 5/30.  PPHx is significant for Depression,  Anxiety, EtOH Abuse, ADHD, and 1 Prior Psychiatric Hospitalization [facility] 01/2024), and no history of Suicide Attempts or Self Injurious Behavior. He reports 1 withdrawal seizure due to Xanax abuse when 19.    Madelyne Schiff has tolerated starting Abilify .  He is having withdrawal symptoms today.  He reports being interested in Residential Rehab and appreciate [employer] assistance with this on Monday.  We will not make any changes to his medications at this time.  We will continue to monitor.    MDD, Recurrent, Severe, w/out Psychosis  Anxiety: -Continue Abilify  5 mg daily for depression and impulsivity -Continue Agitation Protocol: Haldol /Ativan /Zyprexa    -Continue B1 100 mg daily for supplementation -Continue PRN's: Tylenol , Maalox, Atarax , Milk of Magnesia, Trazodone    Dispo: Interested in Residential Rehab   Basilia Bosworth, DO 02/06/2024 1:45 PM

## 2024-02-07 DIAGNOSIS — F10230 Alcohol dependence with withdrawal, uncomplicated: Secondary | ICD-10-CM | POA: Diagnosis not present

## 2024-02-07 NOTE — ED Notes (Signed)
 Pt in bed w/eyes closed resting quietly. Respirations equal and unlabored. No signs or symptoms of distress. Routine safety checks maintained/ongoing.
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
## 2024-02-07 NOTE — ED Notes (Signed)
   02/07/24 0602  BH Safety Checks  Location Bedroom  Visual Appearance Calm  Behavior Sleeping  Sleep (Behavioral Health Patients Only)  Calculate sleep? (Click Yes once per 24 hr at 0600 safety check) Yes  OTHER  Documented sleep last 24 hours 16

## 2024-02-07 NOTE — ED Notes (Signed)
 Pt sitting in dayroom watching television and interacting with peers. No acute distress noted. No concerns voiced. Informed pt to notify staff with any needs or assistance. Pt verbalized understanding and agreement. Will continue to monitor for safety.
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
## 2024-02-07 NOTE — ED Provider Notes (Signed)
 Behavioral Health Progress Note  Date and Time: 02/07/2024 12:29 PM Name: Kyle Webb MRN:  [id number]  Subjective:   Kyle Webb is a 28 yr old male who presented to [facility] on 5/30 with worsening depression in the setting of EtOH Abuse, he was admitted to [facility] on 5/30.  PPHx is significant for Depression, Anxiety, EtOH Abuse, ADHD, and 1 Prior Psychiatric Hospitalization [facility] 01/2024), and no history of Suicide Attempts or Self Injurious Behavior. He reports 1 withdrawal seizure due to Xanax abuse when 19.   He reports he is feeling better than yesterday.  He reports he still has a headache but that it is improved from yesterday and he is tired today.  He reports no cravings.  He reports no side effects to his Abilify .  Discussed with him that if he begins to have cravings we could discuss starting Acamprosate .  He reports he is still interested in Residential Rehab.  He reports no SI, HI, or AVH.  He reports he slept good last night but that he did wake up a few times.  Discussed that he does have Trazodone  as needed ordered and encouraged him to ask for it if needed.  He reports his appetite is good.  He reports no other concerns at present.   Diagnosis:  Final diagnoses:  Alcohol  dependence with uncomplicated withdrawal (HCC)    Total Time spent with patient:  I personally spent 35 minutes on the unit in direct patient care. The direct patient care time included face-to-face time with the patient, reviewing the patient's chart, communicating with other professionals, and coordinating care. Greater than 50% of this time was spent in counseling or coordinating care with the patient regarding goals of hospitalization, psycho-education, and discharge planning needs.   Past Psychiatric History: Depression, Anxiety, EtOH Abuse, ADHD, and 1 Prior Psychiatric Hospitalization [facility] 01/2024), and no history of Suicide Attempts or Self Injurious Behavior. He reports 1 withdrawal seizure due to Xanax  abuse when 19.  Past Medical History: Reports None Family History: Reports None Family Psychiatric  History: Father- EtOH Abuse, Sober for 10 years Multiple Members both sides- EtOH Abuse No Known Suicides Social History: Unhoused.  Additional Social History:                         Sleep: Good  Appetite:  Good  Current Medications:  Current Facility-Administered Medications  Medication Dose Route Frequency Provider Last Rate Last Admin   acetaminophen  (TYLENOL ) tablet 650 mg  650 mg Oral Q6H PRN McLauchlin, Angela, NP       alum & mag hydroxide-simeth (MAALOX/MYLANTA) 200-200-20 MG/5ML suspension 30 mL  30 mL Oral Q4H PRN McLauchlin, Angela, NP       ARIPiprazole  (ABILIFY ) tablet 5 mg  5 mg Oral Daily McLauchlin, Angela, NP   5 mg at 02/07/24 0909   haloperidol  (HALDOL ) tablet 5 mg  5 mg Oral TID PRN McLauchlin, Angela, NP       And   diphenhydrAMINE  (BENADRYL ) capsule 50 mg  50 mg Oral TID PRN McLauchlin, Angela, NP       hydrOXYzine  (ATARAX ) tablet 25 mg  25 mg Oral TID PRN McLauchlin, Angela, NP       magnesium  hydroxide (MILK OF MAGNESIA) suspension 30 mL  30 mL Oral Daily PRN McLauchlin, Angela, NP       OLANZapine  (ZYPREXA ) injection 10 mg  10 mg Intramuscular TID PRN McLauchlin, Angela, NP       OLANZapine  (  ZYPREXA ) injection 5 mg  5 mg Intramuscular TID PRN McLauchlin, Angela, NP       thiamine  (VITAMIN B1) tablet 100 mg  100 mg Oral Daily McLauchlin, Angela, NP   100 mg at 02/07/24 0909   traZODone  (DESYREL ) tablet 50 mg  50 mg Oral QHS PRN McLauchlin, Angela, NP       No current outpatient medications on file.    Labs  Lab Results:  Admission on 02/05/2024  Component Date Value Ref Range Status   WBC 02/05/2024 6.6  4.0 - 10.5 K/uL Final   RBC 02/05/2024 4.40  4.22 - 5.81 MIL/uL Final   Hemoglobin 02/05/2024 14.7  13.0 - 17.0 g/dL Final   HCT 96/12/5407 41.9  39.0 - 52.0 % Final   MCV 02/05/2024 95.2  80.0 - 100.0 fL Final   MCH 02/05/2024 33.4  26.0 -  34.0 pg Final   MCHC 02/05/2024 35.1  30.0 - 36.0 g/dL Final   RDW 81/19/1478 10.9 (L)  11.5 - 15.5 % Final   Platelets 02/05/2024 319  150 - 400 K/uL Final   nRBC 02/05/2024 0.0  0.0 - 0.2 % Final   Neutrophils Relative % 02/05/2024 43  % Final   Neutro Abs 02/05/2024 2.8  1.7 - 7.7 K/uL Final   Lymphocytes Relative 02/05/2024 47  % Final   Lymphs Abs 02/05/2024 3.2  0.7 - 4.0 K/uL Final   Monocytes Relative 02/05/2024 8  % Final   Monocytes Absolute 02/05/2024 0.5  0.1 - 1.0 K/uL Final   Eosinophils Relative 02/05/2024 1  % Final   Eosinophils Absolute 02/05/2024 0.1  0.0 - 0.5 K/uL Final   Basophils Relative 02/05/2024 1  % Final   Basophils Absolute 02/05/2024 0.0  0.0 - 0.1 K/uL Final   Immature Granulocytes 02/05/2024 0  % Final   Abs Immature Granulocytes 02/05/2024 0.02  0.00 - 0.07 K/uL Final   Performed at [facility], [address] [place], [place] [address]   Sodium 02/05/2024 140  135 - 145 mmol/L Final   Potassium 02/05/2024 3.9  3.5 - 5.1 mmol/L Final   Chloride 02/05/2024 102  98 - 111 mmol/L Final   CO2 02/05/2024 23  22 - 32 mmol/L Final   Glucose, Bld 02/05/2024 80  70 - 99 mg/dL Final   Glucose reference range applies only to samples taken after fasting for at least 8 hours.   BUN 02/05/2024 7  6 - 20 mg/dL Final   Creatinine, Ser 02/05/2024 0.55 (L)  0.61 - 1.24 mg/dL Final   Calcium  02/05/2024 9.0  8.9 - 10.3 mg/dL Final   Total Protein 13/04/6577 7.2  6.5 - 8.1 g/dL Final   Albumin 46/96/2952 4.2  3.5 - 5.0 g/dL Final   AST 84/13/2440 33  15 - 41 U/L Final   ALT 02/05/2024 20  0 - 44 U/L Final   Alkaline Phosphatase 02/05/2024 45  38 - 126 U/L Final   Total Bilirubin 02/05/2024 0.5  0.0 - 1.2 mg/dL Final   GFR, Estimated 02/05/2024 >60  >60 mL/min Final   Comment: (NOTE) Calculated using the CKD-EPI Creatinine Equation (2021)    Anion gap 02/05/2024 15  5 - 15 Final   Performed at [facility], [address] [place], [place] [address]    Alcohol , Ethyl (B) 02/05/2024 147 (H)  <15 mg/dL Final   Comment: (NOTE) For medical purposes only. Performed at [facility], [address], [place], [place] [address]  Admission on 01/06/2024, Discharged on 01/19/2024  Component Date Value Ref Range Status   Cholesterol 01/08/2024 246 (H)  0 - 200 mg/dL Final   Triglycerides 09/81/1914 90  <150 mg/dL Final   HDL 78/29/5621 112  >40 mg/dL Final   Total CHOL/HDL Ratio 01/08/2024 2.2  RATIO Final   VLDL 01/08/2024 18  0 - 40 mg/dL Final   LDL Cholesterol 01/08/2024 116 (H)  0 - 99 mg/dL Final   Comment:        Total Cholesterol/HDL:CHD Risk Coronary Heart Disease Risk Table                     Men   Women  1/2 Average Risk   3.4   3.3  Average Risk       5.0   4.4  2 X Average Risk   9.6   7.1  3 X Average Risk  23.4   11.0        Use the calculated Patient Ratio above and the CHD Risk Table to determine the patient's CHD Risk.        ATP III CLASSIFICATION (LDL):  <100     mg/dL   Optimal  308-657  mg/dL   Near or Above                    Optimal  130-159  mg/dL   Borderline  846-962  mg/dL   High  >952     mg/dL   Very High Performed at [facility], [address], [place], [place] [address]    Hgb A1c MFr Bld 01/08/2024 4.3 (L)  4.8 - 5.6 % Final   Comment: (NOTE) Pre diabetes:          5.7%-6.4%  Diabetes:              >6.4%  Glycemic control for   <7.0% adults with diabetes    Mean Plasma Glucose 01/08/2024 76.71  mg/dL Final   Performed at [facility], [address] [place], [place] [address]   TSH 01/08/2024 2.734  0.350 - 4.500 uIU/mL Final   Comment: Performed by a 3rd Generation assay with a functional sensitivity of <=0.01 uIU/mL. Performed at [facility], [address] [place], [place] [address]    Sodium 01/15/2024 138  135 - 145 mmol/L Final   Potassium 01/15/2024 3.9  3.5 - 5.1 mmol/L Final   Chloride 01/15/2024 104  98 - 111 mmol/L Final    CO2 01/15/2024 26  22 - 32 mmol/L Final   Glucose, Bld 01/15/2024 88  70 - 99 mg/dL Final   Glucose reference range applies only to samples taken after fasting for at least 8 hours.   BUN 01/15/2024 14  6 - 20 mg/dL Final   Creatinine, Ser 01/15/2024 0.48 (L)  0.61 - 1.24 mg/dL Final   Calcium  01/15/2024 9.0  8.9 - 10.3 mg/dL Final   Total Protein 66/44/0347 7.1  6.5 - 8.1 g/dL Final   Albumin 42/59/5638 4.1  3.5 - 5.0 g/dL Final   AST 75/64/3329 18  15 - 41 U/L Final   ALT 01/15/2024 13  0 - 44 U/L Final   Alkaline Phosphatase 01/15/2024 35 (L)  38 - 126 U/L Final   Total Bilirubin 01/15/2024 0.6  0.0 - 1.2 mg/dL Final   GFR, Estimated 01/15/2024 >60  >60 mL/min Final   Comment: (NOTE) Calculated using the CKD-EPI Creatinine Equation (2021)  Anion gap 01/15/2024 8  5 - 15 Final   Performed at [facility], [address], [place], [place] [address]   Hepatitis B Surface Ag 01/15/2024 NON REACTIVE  NON REACTIVE Final   HCV Ab 01/15/2024 NON REACTIVE  NON REACTIVE Final   Comment: (NOTE) Nonreactive HCV antibody screen is consistent with no HCV infections,  unless recent infection is suspected or other evidence exists to indicate HCV infection.     Hep A IgM 01/15/2024 NON REACTIVE  NON REACTIVE Final   Hep B C IgM 01/15/2024 NON REACTIVE  NON REACTIVE Final   Performed at [facility], [address] [place], [place] [address]  Admission on 01/05/2024, Discharged on 01/06/2024  Component Date Value Ref Range Status   Sodium 01/05/2024 141  135 - 145 mmol/L Final   Potassium 01/05/2024 3.5  3.5 - 5.1 mmol/L Final   Chloride 01/05/2024 104  98 - 111 mmol/L Final   CO2 01/05/2024 23  22 - 32 mmol/L Final   Glucose, Bld 01/05/2024 96  70 - 99 mg/dL Final   Glucose reference range applies only to samples taken after fasting for at least 8 hours.   BUN 01/05/2024 7  6 - 20 mg/dL Final   Creatinine, Ser 01/05/2024 0.46 (L)  0.61 - 1.24 mg/dL Final   Calcium   01/05/2024 8.4 (L)  8.9 - 10.3 mg/dL Final   Total Protein 82/95/6213 8.2 (H)  6.5 - 8.1 g/dL Final   Albumin 08/65/7846 4.6  3.5 - 5.0 g/dL Final   AST 96/29/5284 31  15 - 41 U/L Final   ALT 01/05/2024 16  0 - 44 U/L Final   Alkaline Phosphatase 01/05/2024 47  38 - 126 U/L Final   Total Bilirubin 01/05/2024 0.4  0.0 - 1.2 mg/dL Final   GFR, Estimated 01/05/2024 >60  >60 mL/min Final   Comment: (NOTE) Calculated using the CKD-EPI Creatinine Equation (2021)    Anion gap 01/05/2024 14  5 - 15 Final   Performed at [facility], [address], [place], [place] [address]   Alcohol , Ethyl (B) 01/05/2024 427 (HH)  <15 mg/dL Final   Comment: CRITICAL RESULT CALLED TO, READ BACK BY AND VERIFIED WITH Norine Beau, RN 01/05/24 1624 J. COLE Please note change in reference range. (NOTE) For medical purposes only. Performed at [facility], [address], [place], [place] [address]    Opiates 01/05/2024 NONE DETECTED  NONE DETECTED Final   Cocaine 01/05/2024 NONE DETECTED  NONE DETECTED Final   Benzodiazepines 01/05/2024 NONE DETECTED  NONE DETECTED Final   Amphetamines 01/05/2024 NONE DETECTED  NONE DETECTED Final   Tetrahydrocannabinol 01/05/2024 POSITIVE (A)  NONE DETECTED Final   Barbiturates 01/05/2024 NONE DETECTED  NONE DETECTED Final   Comment: (NOTE) DRUG SCREEN FOR MEDICAL PURPOSES ONLY.  IF CONFIRMATION IS NEEDED FOR ANY PURPOSE, NOTIFY LAB WITHIN 5 DAYS.  LOWEST DETECTABLE LIMITS FOR URINE DRUG SCREEN Drug Class                     Cutoff (ng/mL) Amphetamine  and metabolites    1000 Barbiturate and metabolites    200 Benzodiazepine                 200 Opiates and metabolites        300 Cocaine and metabolites        300 THC  50 Performed at [facility], [address] [place], [place] [address]    WBC 01/05/2024 6.1  4.0 - 10.5 K/uL Final   RBC 01/05/2024 4.61  4.22 - 5.81 MIL/uL Final    Hemoglobin 01/05/2024 15.6  13.0 - 17.0 g/dL Final   HCT 56/21/3086 45.2  39.0 - 52.0 % Final   MCV 01/05/2024 98.0  80.0 - 100.0 fL Final   MCH 01/05/2024 33.8  26.0 - 34.0 pg Final   MCHC 01/05/2024 34.5  30.0 - 36.0 g/dL Final   RDW 57/84/6962 11.6  11.5 - 15.5 % Final   Platelets 01/05/2024 425 (H)  150 - 400 K/uL Final   nRBC 01/05/2024 0.0  0.0 - 0.2 % Final   Neutrophils Relative % 01/05/2024 54  % Final   Neutro Abs 01/05/2024 3.3  1.7 - 7.7 K/uL Final   Lymphocytes Relative 01/05/2024 40  % Final   Lymphs Abs 01/05/2024 2.4  0.7 - 4.0 K/uL Final   Monocytes Relative 01/05/2024 4  % Final   Monocytes Absolute 01/05/2024 0.3  0.1 - 1.0 K/uL Final   Eosinophils Relative 01/05/2024 1  % Final   Eosinophils Absolute 01/05/2024 0.0  0.0 - 0.5 K/uL Final   Basophils Relative 01/05/2024 1  % Final   Basophils Absolute 01/05/2024 0.1  0.0 - 0.1 K/uL Final   Immature Granulocytes 01/05/2024 0  % Final   Abs Immature Granulocytes 01/05/2024 0.02  0.00 - 0.07 K/uL Final   Performed at [facility], [address], [place], [place] [address]   Acetaminophen  (Tylenol ), Serum 01/05/2024 <10 (L)  10 - 30 ug/mL Final   Comment: (NOTE) Therapeutic concentrations vary significantly. A range of 10-30 ug/mL  may be an effective concentration for many patients. However, some  are best treated at concentrations outside of this range. Acetaminophen  concentrations >150 ug/mL at 4 hours after ingestion  and >50 ug/mL at 12 hours after ingestion are often associated with  toxic reactions.  Performed at [facility], [address], [place], [place] [address]    Salicylate Lvl 01/05/2024 <7.0 (L)  7.0 - 30.0 mg/dL Final   Performed at [facility], [address], [place], [place] [address]    Blood Alcohol  level:  Lab Results  Component Value Date   ETH 147 (H) 02/05/2024   ETH 427 (HH) 01/05/2024    Metabolic Disorder Labs: Lab  Results  Component Value Date   HGBA1C 4.3 (L) 01/08/2024   MPG 76.71 01/08/2024   MPG 99.67 05/13/2021   No results found for: "PROLACTIN" Lab Results  Component Value Date   CHOL 246 (H) 01/08/2024   TRIG 90 01/08/2024   HDL 112 01/08/2024   CHOLHDL 2.2 01/08/2024   VLDL 18 01/08/2024   LDLCALC 116 (H) 01/08/2024   LDLCALC 111 (H) 05/21/2022    Therapeutic Lab Levels: No results found for: "LITHIUM" No results found for: "VALPROATE" No results found for: "CBMZ"  Physical Findings   AUDIT    Flowsheet Row [facility] Admission (Discharged) [facility] Office Visit from 05/21/2022 in [facility] [place]  Alcohol  Use Disorder Identification Test Final Score (AUDIT) 18 18 15       PHQ2-9    Flowsheet Row ED from 02/05/2024 in [facility] Office Visit from 05/21/2022 in [facility]  [place] Office Visit from 03/03/2016 in [facility] Office Visit from 03/14/2015 in [facility]  PHQ-2 Total Score 3 0 0 0  PHQ-9 Total Score 15 -- 1 0      Flowsheet Row ED from 02/05/2024 in [facility] Most recent reading at 02/06/2024 12:41 AM ED from 02/05/2024 in [facility] Most recent reading at 02/05/2024  7:58 PM Admission (Discharged) [facility] Most recent reading at 01/19/2024  7:13 PM  C-SSRS RISK CATEGORY Moderate Risk Moderate Risk High Risk        Musculoskeletal  Strength & Muscle Tone: within normal limits Gait & Station: normal Patient leans: N/A  Psychiatric Specialty Exam  Presentation  General Appearance:  Casual; Appropriate for Environment  Eye Contact: Good  Speech: Clear and Coherent; Normal  Rate  Speech Volume: Normal  Handedness: Right   Mood and Affect  Mood: Dysphoric  Affect: Congruent   Thought Process  Thought Processes: Coherent  Descriptions of Associations:Intact  Orientation:Full (Time, Place and Person)  Thought Content:WDL; Logical  Diagnosis of Schizophrenia or Schizoaffective disorder in past: No    Hallucinations:Hallucinations: None  Ideas of Reference:None  Suicidal Thoughts:Suicidal Thoughts: No  Homicidal Thoughts:Homicidal Thoughts: No   Sensorium  Memory: Immediate Fair  Judgment: Fair  Insight: Fair   Art therapist  Concentration: Fair  Attention Span: Fair  Recall: [employer] of Knowledge: Fair  Language: Fair   Psychomotor Activity  Psychomotor Activity: Psychomotor Activity: Normal   Assets  Assets: Communication Skills; Desire for Improvement; Resilience   Sleep  Sleep: Sleep: Good   No data recorded   Physical Exam  Physical Exam Vitals and nursing note reviewed.  Constitutional:      General: He is not in acute distress.    Appearance: Normal appearance. He is normal weight. He is not ill-appearing or toxic-appearing.  HENT:     Head: Normocephalic and atraumatic.  Pulmonary:     Effort: Pulmonary effort is normal.  Musculoskeletal:        General: Normal range of motion.  Neurological:     General: No focal deficit present.     Mental Status: He is alert.    Review of Systems  Respiratory:  Negative for cough and shortness of breath.   Cardiovascular:  Negative for chest pain.  Gastrointestinal:  Negative for abdominal pain, constipation, diarrhea, nausea and vomiting.  Neurological:  Positive for headaches. Negative for dizziness and weakness.  Psychiatric/Behavioral:  Negative for depression, hallucinations and suicidal ideas. The patient is not nervous/anxious.    Blood pressure 116/80, pulse 77, temperature 97.7 F (36.5 C), temperature source Oral, resp.  rate 17, SpO2 98%. There is no height or weight on file to calculate BMI.  Treatment Plan Summary: Daily contact with patient to assess and evaluate symptoms and progress in treatment and Medication management  Kyle Webb is a 28 yr old male who presented to [facility] on 5/30 with worsening depression in the setting of EtOH Abuse, he was admitted to [facility] on 5/30.  PPHx is significant for Depression, Anxiety, EtOH Abuse, ADHD, and 1 Prior Psychiatric Hospitalization [facility] 01/2024), and no history of Suicide Attempts or Self Injurious Behavior. He reports 1 withdrawal seizure due to Xanax abuse when 19.    Kyle Webb is continuing to have some withdrawal symptoms but these are lessening.  He reports no cravings at present but did discuss starting  Acamprosate  with him.  He continues to be interested in Residential Rehab and appreciate [employer] assistance with this.  We will not make any changes to his medications at this time.  We will continue to monitor.   MDD, Recurrent, Severe, w/out Psychosis  Anxiety: -Continue Abilify  5 mg daily for depression and impulsivity -Continue Agitation Protocol: Haldol /Ativan /Zyprexa    -Continue B1 100 mg daily for supplementation -Continue PRN's: Tylenol , Maalox, Atarax , Milk of Magnesia, Trazodone    Dispo: Interested in Residential Rehab   Basilia Bosworth, DO 02/07/2024 12:29 PM

## 2024-02-07 NOTE — ED Notes (Signed)
 Patient is in the bedroom calm and sleeping.NAD. Environment secured per policy. Will monitor for safety.

## 2024-02-07 NOTE — ED Notes (Signed)
 Patient A&Ox4. Denies intent to harm self/others when asked. Denies A/VH. Patient denies any physical complaints when asked. No acute distress noted. Support and encouragement provided. Routine safety checks conducted according to facility protocol. Encouraged patient to notify staff if thoughts of harm toward self or others arise. Patient verbalize understanding and agreement. Will continue to monitor for safety.

## 2024-02-07 NOTE — ED Notes (Signed)
 Pt sitting in cafeteria eating dinner, watching television and interacting with peers. No acute distress noted. No concerns voiced. Informed pt to notify staff with any needs or assistance. Pt verbalized understanding and agreement. Will continue to monitor for safety.

## 2024-02-08 NOTE — ED Notes (Signed)
 Pt presented in day room watching TV.  Calm and cooperative.  When was asked what happened PTA pt stated he was feeling down and drank to much.   He stated he had no slept for a couple of days before admission.  He feels like his dad might be upset with him regarding situation. Pt reported he has providers in community.  Reported IOP didn't work well for him in the past Denied current SI plan and intent, Denied Hi and A/V hallucinations

## 2024-02-08 NOTE — ED Notes (Signed)
 Patient is in bed calm and sleeping. NAD. Will keep monitoring for safety.

## 2024-02-08 NOTE — ED Notes (Signed)
 Pt was provided dinner.

## 2024-02-08 NOTE — ED Notes (Signed)
 Patient asleep, NAD. Will keep monitoring for safety.

## 2024-02-08 NOTE — ED Notes (Signed)
Patient is sleeping. Respirations equal and unlabored, skin warm and dry, NAD. No change in assessment or acuity. Routine safety checks conducted according to facility protocol. Will continue to monitor for safety.   
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
## 2024-02-08 NOTE — ED Notes (Signed)
 Pt is in the dayroom watching TV with peers..No acute distress noted. Will continue to monitor for safety and provide support.

## 2024-02-08 NOTE — ED Notes (Signed)
 Pt ate dinner and participated in RN psycho education group. No complaints or concerns reported to RN. Pt currently watching TV in day room with other pts. No distress observed.

## 2024-02-08 NOTE — ED Notes (Signed)
 Pt has been visible in the milieu.  Calm and withdrawn.  Pt attended group.  Q 15 minute rounding continue for safety
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
## 2024-02-08 NOTE — ED Notes (Signed)
 Pt was provided lunch

## 2024-02-08 NOTE — Discharge Planning (Signed)
 LCSW met with patient to assess current mood, affect, physical state, and inquire about needs/goals while here in [facility] and after discharge. Patient reports he presented due to alcohol  abuse and needing detox. Patient reports he was has been living with his parents and currently unemployed. Patient denies having access to transportation, and reports having limited social support. Patient reports drinking 8-10 beers daily since the age of 29. He stated that he was in [facility] for 28 days and then 5 days ago walked to his grandmother's house and it began raining on him and "was angry so I got drunk". He stated that when he came home to his parents house his father brought him to [facility]. Patient stated he had been to many different outpatient and inpatient residential programs through the years and his longest period of sobriety was 6 months about 3-4 years ago. Patient currently denies any SI/HI/AVH and reports mood as calm with congruent affect. Patient previously used [facility] on [address] for medication management and is agreeable to return for psychiatry and counseling for outpatient follow up care when he is stable for DC to his friends home.  Patient expressed understanding and appreciation of LCSW assistance. No other needs were reported at this time by patient.

## 2024-02-09 DIAGNOSIS — F10230 Alcohol dependence with withdrawal, uncomplicated: Secondary | ICD-10-CM | POA: Diagnosis not present

## 2024-02-09 MED ORDER — SERTRALINE HCL 50 MG PO TABS
50.0000 mg | ORAL_TABLET | Freq: Every day | ORAL | Status: DC
  Administered 2024-02-09: 50 mg via ORAL
  Filled 2024-02-09: qty 1

## 2024-02-09 NOTE — ED Notes (Signed)
 Pt observed in the dayroom in AA meeting.

## 2024-02-09 NOTE — ED Notes (Signed)
 Pt observable in milieu.  Calm and cooperative.  No needs identified at present, Denied current SI plan and intent  Q 15 minute observations for safety continue

## 2024-02-09 NOTE — ED Notes (Signed)
 Patient is sleeping. Respirations equal and unlabored, skin warm and dry. No change in assessment or acuity. Routine safety checks conducted according to facility protocol. Will continue to monitor for safety.

## 2024-02-09 NOTE — Discharge Planning (Signed)
 SW made follow up appointment for patient at [facility] behavioral health on Tuesday 6/17 @ 11:00. For psychiatry and individual counseling assessments. Instructions are provided in patients AVS. Patient is anticipated ready for DC tomorrow on 6/4. Will continue to follow.

## 2024-02-09 NOTE — ED Notes (Signed)
 Pt is in his room awake because of the guy next door is snoring.

## 2024-02-09 NOTE — ED Notes (Signed)
 Pt has been calm and cooperative this shift.   Behaviors appropriate Q 15 minute observations for safety continue

## 2024-02-09 NOTE — Discharge Planning (Signed)
 SW made call to patients father and unable to reach and left message to return call to the SW. Will continue to follow.
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
## 2024-02-09 NOTE — ED Notes (Signed)
Patient is sleeping. Respirations equal and unlabored, skin warm and dry, NAD. No change in assessment or acuity. Routine safety checks conducted according to facility protocol. Will continue to monitor for safety.   

## 2024-02-09 NOTE — Discharge Instructions (Addendum)
 [facility] at [facility] [address] Johnella Naas [place], [place] [address] [phone]  Follow up appointment scheduled for Tuesday 6/17 @ 11:00 with Dr. Boston Byers.  LCSW spoke with patient regarding plans at discharge. Patient aware of resources provided at the [facility] New Patient Assessment/Therapy Walk ins:. Instructions provided in AVS. Patient expressed appreciation for LCSW assistance with discharge planning. Patient reports feeling safe to discharge and reports having support from family when he returns home. No other needs were reported at this time. LCSW to sign off. Please inform if further LCSW needs arise prior to discharge.   [facility] New Patient Assessment/Therapy Walk ins:  Walk in hours for open access for psychiatry are Monday-Friday 8 am to 11 pm. Appointments are limited, so please arrive at 7:00 am.   Open access for therapy appointments is Monday-Friday from 8:00 am to 11:00 am and Friday from 1 pm to 4 pm. Please arrive at 7:30 am.  Specialty:    Urgent Care (On Second Floor) [address] [place] [address] [phone]

## 2024-02-10 DIAGNOSIS — F10230 Alcohol dependence with withdrawal, uncomplicated: Secondary | ICD-10-CM | POA: Diagnosis not present

## 2024-02-10 MED ORDER — TRAZODONE HCL 50 MG PO TABS: 50.0000 mg | ORAL_TABLET | Freq: Every evening | ORAL | 0 refills | Status: DC | PRN

## 2024-02-10 MED ORDER — HYDROXYZINE HCL 25 MG PO TABS: 25.0000 mg | ORAL_TABLET | Freq: Three times a day (TID) | ORAL | 0 refills | Status: DC | PRN

## 2024-02-10 MED ORDER — SERTRALINE HCL 50 MG PO TABS: 50.0000 mg | ORAL_TABLET | Freq: Every day | ORAL | 0 refills | Status: DC

## 2024-02-10 MED ORDER — ARIPIPRAZOLE 5 MG PO TABS: 5.0000 mg | ORAL_TABLET | Freq: Every day | ORAL | 1 refills | Status: DC

## 2024-02-10 NOTE — ED Notes (Signed)
 Discharge paperwork explained to pt, pt denied questions or concerns. RN reviewed medications, AVS. Items returned. RN escorted pt off unit to taxi.

## 2024-02-10 NOTE — ED Notes (Signed)
 Pt accepted all morning medications, RN reviewed medications. Pt denied concerns or complaints, generally cooperative and pleasant. Pt denies si hi and avh- verbal contract for safety provided.

## 2024-02-10 NOTE — ED Provider Notes (Signed)
 FBC/OBS ASAP Discharge Summary  Date and Time: 02/10/2024 9:52 AM  Name: Kyle Webb  MRN:  [id number]   Discharge Diagnoses:  Final diagnoses:  Alcohol  dependence with uncomplicated withdrawal (HCC)    Subjective: "I'm feeling good."  Stay Summary: 28 yo male admitted for alcohol  dependency with withdrawal/detox along with depression and anxiety.  He was recently discharged from inpatient at [facility] in May and refused discharge follow-up and medications.  The [facility] alcohol  detox protocol was started along with his medications and therapy.  Today, he denies withdrawal symptoms and cravings.  Discussed rehab and/or SA IOP to which the patient declined.  He was agreeable to return to [employer] as he stated, "I noticed I don't drink when I go to these."  Discussed naltrexone  to assist in his recovery, he declined.  He reported he would return here if his depression and anxiety returned, encouraged him to attend therapy and use the resources provided by the social worker.    He denied depression and suicidal ideations. No anxiety, "I'm cool as a cucumber today."  His sleep was "pretty good",  appetite is "good".  Kyle Webb has met maximum capacity at the [facility] and stable for discharge.  Discharge instructions provided along with explanations, RX, crisis numbers, and follow-up resources including an appointment today.  Total Time spent with patient: 30 minutes  Past Psychiatric History: a few Past Medical History: none Family History: none Family Psychiatric History: father with 10 years of sobriety from alcohol  Social History: living with a friend, recently estranged from family Tobacco Cessation:  A prescription for an FDA-approved tobacco cessation medication was offered at discharge and the patient refused  Current Medications:  Current Facility-Administered Medications  Medication Dose Route Frequency Provider Last Rate Last Admin   acetaminophen  (TYLENOL ) tablet 650 mg  650 mg Oral Q6H PRN  McLauchlin, Angela, NP       alum & mag hydroxide-simeth (MAALOX/MYLANTA) 200-200-20 MG/5ML suspension 30 mL  30 mL Oral Q4H PRN McLauchlin, Angela, NP       ARIPiprazole  (ABILIFY ) tablet 5 mg  5 mg Oral Daily McLauchlin, Angela, NP   5 mg at 02/10/24 0941   haloperidol  (HALDOL ) tablet 5 mg  5 mg Oral TID PRN McLauchlin, Angela, NP       And   diphenhydrAMINE  (BENADRYL ) capsule 50 mg  50 mg Oral TID PRN McLauchlin, Angela, NP       hydrOXYzine  (ATARAX ) tablet 25 mg  25 mg Oral TID PRN McLauchlin, Angela, NP       magnesium  hydroxide (MILK OF MAGNESIA) suspension 30 mL  30 mL Oral Daily PRN McLauchlin, Angela, NP       OLANZapine  (ZYPREXA ) injection 10 mg  10 mg Intramuscular TID PRN McLauchlin, Angela, NP       OLANZapine  (ZYPREXA ) injection 5 mg  5 mg Intramuscular TID PRN McLauchlin, Angela, NP       sertraline (ZOLOFT) tablet 50 mg  50 mg Oral Daily Nkwenti, Doris, NP   50 mg at 02/09/24 2109   thiamine  (VITAMIN B1) tablet 100 mg  100 mg Oral Daily McLauchlin, Angela, NP   100 mg at 02/10/24 4098   traZODone  (DESYREL ) tablet 50 mg  50 mg Oral QHS PRN McLauchlin, Angela, NP   50 mg at 02/09/24 2109   Current Outpatient Medications  Medication Sig Dispense Refill   ARIPiprazole  (ABILIFY ) 5 MG tablet Take 1 tablet (5 mg total) by mouth daily. 30 tablet 1   hydrOXYzine  (ATARAX ) 25 MG tablet  Take 1 tablet (25 mg total) by mouth 3 (three) times daily as needed for anxiety. 30 tablet 0   sertraline (ZOLOFT) 50 MG tablet Take 1 tablet (50 mg total) by mouth daily. 30 tablet 0   traZODone  (DESYREL ) 50 MG tablet Take 1 tablet (50 mg total) by mouth at bedtime as needed for sleep. 30 tablet 0    PTA Medications:  Facility Ordered Medications  Medication   acetaminophen  (TYLENOL ) tablet 650 mg   alum & mag hydroxide-simeth (MAALOX/MYLANTA) 200-200-20 MG/5ML suspension 30 mL   magnesium  hydroxide (MILK OF MAGNESIA) suspension 30 mL   haloperidol  (HALDOL ) tablet 5 mg   And   diphenhydrAMINE   (BENADRYL ) capsule 50 mg   OLANZapine  (ZYPREXA ) injection 5 mg   OLANZapine  (ZYPREXA ) injection 10 mg   traZODone  (DESYREL ) tablet 50 mg   hydrOXYzine  (ATARAX ) tablet 25 mg   thiamine  (VITAMIN B1) tablet 100 mg   ARIPiprazole  (ABILIFY ) tablet 5 mg   sertraline (ZOLOFT) tablet 50 mg   PTA Medications  Medication Sig   ARIPiprazole  (ABILIFY ) 5 MG tablet Take 1 tablet (5 mg total) by mouth daily.   hydrOXYzine  (ATARAX ) 25 MG tablet Take 1 tablet (25 mg total) by mouth 3 (three) times daily as needed for anxiety.   sertraline (ZOLOFT) 50 MG tablet Take 1 tablet (50 mg total) by mouth daily.   traZODone  (DESYREL ) 50 MG tablet Take 1 tablet (50 mg total) by mouth at bedtime as needed for sleep.       02/10/2024    9:48 AM 02/10/2024    9:29 AM 02/06/2024    1:44 PM  Depression screen PHQ 2/9  Decreased Interest 0 0 1  Down, Depressed, Hopeless 0 0 2  PHQ - 2 Score 0 0 3  Altered sleeping 0 0 2  Tired, decreased energy 0 0 1  Change in appetite 0 0 1  Feeling bad or failure about yourself  0 0 3  Trouble concentrating 0 0 1  Moving slowly or fidgety/restless 0 0 3  Suicidal thoughts 0 0 1  PHQ-9 Score 0 0 15  Difficult doing work/chores Not difficult at all Not difficult at all Very difficult    Flowsheet Row ED from 02/05/2024 in [facility] Most recent reading at 02/10/2024  9:48 AM ED from 02/05/2024 in [facility] Most recent reading at 02/05/2024  7:58 PM [facility] (Dis[facility] Most recent reading at 01/19/2024  7:13 PM  C-SSRS RISK CATEGORY Moderate Risk Moderate Risk High Risk       Musculoskeletal  Strength & Muscle Tone: within normal limits Gait & Station: normal Patient leans: N/A  Psychiatric Specialty Exam: Physical Exam Vitals and nursing note reviewed.  Constitutional:      Appearance: Normal appearance.  HENT:     Head: Normocephalic.      Nose: Nose normal.  Pulmonary:     Effort: Pulmonary effort is normal.  Musculoskeletal:        General: Normal range of motion.     Cervical back: Normal range of motion.  Neurological:     General: No focal deficit present.     Mental Status: He is alert and oriented to person, place, and time.     Review of Systems  Psychiatric/Behavioral:  Positive for substance abuse.   All other systems reviewed and are negative.   Blood pressure 120/85, pulse 72, temperature 97.7 F (36.5 C), resp. rate  16, SpO2 100%.There is no height or weight on file to calculate BMI.  General Appearance: Casual  Eye Contact:  Good  Speech:  Normal Rate  Volume:  Normal  Mood:  denied depression and anxiety, "cool as a cucumber"  Affect:  Congruent  Thought Process:  Coherent  Orientation:  Full (Time, Place, and Person)  Thought Content:  WDL and Logical  Suicidal Thoughts:  No  Homicidal Thoughts:  No  Memory:  Immediate;   Good Recent;   Good Remote;   Good  Judgement:  Fair  Insight:  Fair  Psychomotor Activity:  Normal  Concentration:  Concentration: Good and Attention Span: Good  Recall:  Good  Fund of Knowledge:  Good  Language:  Good  Akathisia:  No  Handed:  Right  AIMS (if indicated):     Assets:  Housing Leisure Time Physical Health Resilience Social Support Vocational/Educational  ADL's:  Intact  Cognition:  WNL  Sleep:        Physical Exam  Physical Exam Vitals and nursing note reviewed.  Constitutional:      Appearance: Normal appearance.  HENT:     Head: Normocephalic.     Nose: Nose normal.  Pulmonary:     Effort: Pulmonary effort is normal.  Musculoskeletal:        General: Normal range of motion.     Cervical back: Normal range of motion.  Neurological:     General: No focal deficit present.     Mental Status: He is alert and oriented to person, place, and time.    Review of Systems  Psychiatric/Behavioral:  Positive for substance abuse.   All other  systems reviewed and are negative.  Blood pressure 120/85, pulse 72, temperature 97.7 F (36.5 C), resp. rate 16, SpO2 100%. There is no height or weight on file to calculate BMI.  Demographic Factors:  Male and Caucasian  Loss Factors: NA  Historical Factors: Family history of mental illness or substance abuse  Risk Reduction Factors:   Sense of responsibility to family, Employed, Living with another person, especially a relative, Positive social support, Positive therapeutic relationship, and Positive coping skills or problem solving skills  Continued Clinical Symptoms:  Alcohol  use disorder, severe, in early remission  Cognitive Features That Contribute To Risk:  None    Suicide Risk:  Moderate:  Frequent suicidal ideation with limited intensity, and duration, some specificity in terms of plans, no associated intent, good self-control, limited dysphoria/symptomatology, some risk factors present, and identifiable protective factors, including available and accessible social support.  Plan Of Care/Follow-up recommendations:  Activity:  as tolerated Diet:  heart healthy diet Alcohol  use d/o: Recommended rehab and/or SA IOP, patient declined 12-step programs like AA, agreeable, encouraged him to get a sponsor Offered naltrexone , patient declined  Depression: Abilify  5 mg daily Zoloft 50 mg daily  Anxiety: Hydroxyzine  25 mg TID PRN anxiety  Insomnia: Trazodone  50 mg at bedtime PRN  Disposition: discharge to his friend with follow up resources by the SW.  Roslynn Coombes, NP 02/10/2024, 9:52 AM

## 2024-02-10 NOTE — ED Notes (Signed)
 Patient is sleeping. Respirations equal and unlabored, skin warm and dry. No change in assessment or acuity. Routine safety checks conducted according to facility protocol. Will continue to monitor for safety.

## 2024-02-23 ENCOUNTER — Ambulatory Visit: Admitting: Internal Medicine

## 2024-03-03 ENCOUNTER — Telehealth: Payer: Self-pay | Admitting: *Deleted

## 2024-03-03 NOTE — Telephone Encounter (Signed)
 Copied from CRM [id number]. Topic: Clinical - Medical Advice >> Mar 03, 2024  9:07 AM Thersia BROCKS wrote: Reason for CRM: Patient mom called in regarding his Behavioral Health medication, providers stated that patient would need to speak with Primary Provider regarding refills, Dr.Morrison first available is 07/25 and patient will be out of medication in 2 weeks, would like for someone to give her a callback on what they could do   [phone]

## 2024-03-07 ENCOUNTER — Telehealth (HOSPITAL_COMMUNITY): Payer: Self-pay | Admitting: *Deleted

## 2024-03-07 ENCOUNTER — Telehealth: Payer: Self-pay

## 2024-03-07 NOTE — Telephone Encounter (Signed)
 Pt LVM requesting refills of the medications he got from the hospital. [facility] and [facility] will provide a 30 day supply of meds  in most cases, then pt is expected to f/u with any appointments/referrals made. They do not continue refilling after pt has been discharged. Looks like he was referred to the [facility] but was a no show and has refused f/u. Pt can go back to [facility] or the walk in clinic at [facility] for which he would need to present by 0700 as pt's are seen on first come, first serve basis. However if pt still has private insurance he would not be able to f/u at [facility]. We are very limited on new pt appointments but if your office wants to refer him to our office we'll see what we can do. Hope this sheds some light on the process.

## 2024-03-07 NOTE — Telephone Encounter (Signed)
 See below.  Copied from CRM [id number]. Topic: Clinical - Medical Advice >> Mar 03, 2024  9:07 AM Thersia BROCKS wrote: Reason for CRM: Patient mom called in regarding his Behavioral Health medication, providers stated that patient would need to speak with Primary Provider regarding refills, Dr.Morrison first available is 07/25 and patient will be out of medication in 2 weeks, would like for someone to give her a callback on what they could do   [phone] >> Mar 07, 2024 10:03 AM Arm[place] J wrote: Patient's mother called in to let us  know that she could not get through to the [facility] center he was at and so she could not ask for an extension on his medication. She was wondering if we could try to reach out instead in hopes for a different result. The patient is also scheduled for July 21st but he is down to a weeks worth of medication.  [facility] [employer] [phone]

## 2024-03-08 ENCOUNTER — Ambulatory Visit (HOSPITAL_COMMUNITY)
Admission: EM | Admit: 2024-03-08 | Discharge: 2024-03-08 | Disposition: A | Discharge status: Home / Self Care | Attending: Nurse Practitioner | Admitting: Nurse Practitioner

## 2024-03-08 DIAGNOSIS — Z79899 Other long term (current) drug therapy: Secondary | ICD-10-CM | POA: Diagnosis not present

## 2024-03-08 MED ORDER — ARIPIPRAZOLE 5 MG PO TABS: 5.0000 mg | ORAL_TABLET | Freq: Every day | ORAL | 0 refills | Status: AC

## 2024-03-08 MED ORDER — TRAZODONE HCL 50 MG PO TABS: 50.0000 mg | ORAL_TABLET | Freq: Every evening | ORAL | 0 refills | Status: AC | PRN

## 2024-03-08 MED ORDER — SERTRALINE HCL 50 MG PO TABS: 50.0000 mg | ORAL_TABLET | Freq: Every day | ORAL | 0 refills | Status: AC

## 2024-03-08 MED ORDER — HYDROXYZINE HCL 25 MG PO TABS: 25.0000 mg | ORAL_TABLET | Freq: Three times a day (TID) | ORAL | 0 refills | Status: AC | PRN

## 2024-03-08 NOTE — ED Provider Notes (Signed)
 Behavioral Health Urgent Care Medical Screening Exam  Patient Name: Kyle Webb MRN: [id number] Date of Evaluation: 03/08/24 Chief Complaint:  Medication refill Diagnosis:  Final diagnoses:  Medication management   History of Present illness: Kyle Webb is a 28 y.o. male with a history of alcohol  use d/o & MDD who presented to the [facility] with his father for medication refills. Per triage: [name] presents to [facility] voluntarily acoompanied by his father. Pt states that he needs refills on his medication (Abilify , Trazadone, Zoloft , and Hydroxyzine ). Pt currently denies SI, HI, AVH and drug use. Pt states that he drank 3 12oz beers last night. Pt states that we can best assist him by providing him with medication refills.  Assessment: During encounter with patient, he presents with a euthymic mood, attention to personal hygiene and grooming is fair, eye contact is good, speech is clear & coherent. Thought contents are organized and logical, and pt currently denies SI/HI/AVH or paranoia. There is no evidence of delusional thoughts.  There are no overt signs of psychosis. Pt denies first rank symptoms.  Denies depressive symptoms. Denies any current issues with sleep, states that he is sleeping for 8 hrs nightly, still has 8 tabs of the Trazodone  50 mg pills left, states that this med has been helpful, and that the med was started a month ago when he was hospitalized at the [facility] here at the [facility]. He states that same is true of the Abilify  5 mg tabs, and same true of Zoloft  50 mg tabs-states he has 3 tabs of each of these meds respectively left. States that he ran out of the Hydroxyzine  3 days ago, states that all of his medications are helpful for mood, anxiety and sleep. States that he drank three bottles of beer yesterday after two weeks of sobriety. Writer inquired about rehab, and pt states that he is not interested at this time to go to rehab for substance use, states that he  intends to surround himself with sober people and keep busy with work in order to abstain from using alcohol  again. Education on AA and NA provided and pt verbalizes understanding.  Patient is not in any acute distress today, and he has been provided with a 14 days worth of medications scripts, and appointment made with an outpatient behavioral health provider, Dr Shirline with [facility] for 03/18/2024 @ 4:10 pm. Pt & his father educated on this, also educated on the fact that no more medication refills will be completed at this location, and on the fact that they need to attend the appointment above, and both verbalize understanding and left [facility] in no acute distress.  Suicide Risk Assessment: Minimal: No identifiable suicidal ideation.  Patients presenting with no risk factors but with morbid ruminations; may be classified as minimal risk based on the severity of the depressive symptoms.   Flowsheet Row ED from 03/08/2024 in [facility] Most recent reading at 03/08/2024  9:38 AM ED from 02/05/2024 in [facility] Most recent reading at 02/10/2024  9:48 AM ED from 02/05/2024 in [facility] Most recent reading at 02/05/2024  7:58 PM  C-SSRS RISK CATEGORY No Risk Moderate Risk Moderate Risk   Psychiatric Specialty Exam  Presentation  General Appearance:Appropriate for Environment; Casual  Eye Contact:Fair  Speech:Clear and Coherent  Speech Volume:Normal  Handedness:Right  Mood and Affect  Mood: Euthymic  Affect: Congruent   Thought Process  Thought Processes: Coherent  Descriptions of  Associations:Intact  Orientation:Full (Time, Place and Person)  Thought Content:Logical  Diagnosis of Schizophrenia or Schizoaffective disorder in past: No   Hallucinations:None  Ideas of Reference:None  Suicidal Thoughts:No  Homicidal Thoughts:No   Sensorium  Memory: Immediate  Fair  Judgment: Fair  Insight: Good  Executive Functions  Concentration: Good  Attention Span: Good  Recall: Good  Fund of Knowledge: Good  Language: Good   Psychomotor Activity  Psychomotor Activity: Normal  Assets  Assets: Communication Skills; Social Support  Sleep  Sleep: Good  Number of hours:  6.45  Physical Exam: Physical Exam Vitals and nursing note reviewed.  HENT:     Nose: Nose normal.   Neurological:     Mental Status: He is alert.   Psychiatric:        Mood and Affect: Mood normal.        Behavior: Behavior normal.        Thought Content: Thought content normal.        Judgment: Judgment normal.    Review of Systems  Psychiatric/Behavioral:  Positive for substance abuse (Educated on cessation). Negative for depression, hallucinations, memory loss and suicidal ideas. The patient is nervous/anxious (stable) and has insomnia (stable).   All other systems reviewed and are negative.  Blood pressure 103/79, pulse 69, temperature 97.8 F (36.6 C), temperature source Oral, resp. rate 16, SpO2 99%. There is no height or weight on file to calculate BMI.  Musculoskeletal: Strength & Muscle Tone: within normal limits Gait & Station: normal Patient leans: N/A  [facility] MSE Discharge Disposition for Follow up and Recommendations: Based on my evaluation the patient does not appear to have an emergency medical condition and can be discharged with resources and follow up care in outpatient services for Medication Management and Individual Therapy  Donia Snell, NP 03/08/2024, 11:16 AM

## 2024-03-08 NOTE — Telephone Encounter (Signed)
 Called left message for pt to call last provider he received from to see if they could give him enough til visit here per pcp here.

## 2024-03-08 NOTE — ED Notes (Signed)
 Patient discharged by provider.

## 2024-03-08 NOTE — Discharge Instructions (Signed)

## 2024-03-08 NOTE — Progress Notes (Signed)
   03/08/24 0917  [facility] Triage Screening (Walk-ins at [facility] only)  How Did You Hear About Us ? Family/Friend  What Is the Reason for Your Visit/Call Today? Wen Merced presents to [facility] voluntarily acoompanied by his father. Pt states that he needs refills on his medication (Abilify , Trazadone, Zoloft , and Hydroxyzine ). Pt currently denies SI, HI, AVH and drug use. Pt states that he drank 3 12oz beers last night. Pt states that we can best assist him by providing him with medication refills.  How Long Has This Been Causing You Problems? 1-6 months  Have You Recently Had Any Thoughts About Hurting Yourself? No  Are You Planning to Commit Suicide/Harm Yourself At This time? No  Have you Recently Had Thoughts About Hurting Someone Sherral? No  Are You Planning To Harm Someone At This Time? No  Physical Abuse Denies  Verbal Abuse Denies  Sexual Abuse Denies  Exploitation of patient/patient's resources Denies  Self-Neglect Denies  Are you currently experiencing any auditory, visual or other hallucinations? No  Have You Used Any Alcohol  or Drugs in the Past 24 Hours? Yes  What Did You Use and How Much? last night - 3 beers 12oz  Do you have any current medical co-morbidities that require immediate attention? No  Clinician description of patient physical appearance/behavior: calm, cooperative, groomed  What Do You Feel Would Help You the Most Today? Medication(s)  If access to [facility] was not available, would you have sought care in the Emergency Department? No  Determination of Need Routine (7 days)  Options For Referral Medication Management

## 2024-03-08 NOTE — Telephone Encounter (Signed)
 Tried to call pt via phone pt did not answer left message to call provider that has been giving to him to see if they could continue giving to him per our provider here.

## 2024-03-18 DIAGNOSIS — F1010 Alcohol abuse, uncomplicated: Secondary | ICD-10-CM | POA: Diagnosis not present

## 2024-03-18 DIAGNOSIS — F331 Major depressive disorder, recurrent, moderate: Secondary | ICD-10-CM | POA: Diagnosis not present

## 2024-03-18 DIAGNOSIS — F413 Other mixed anxiety disorders: Secondary | ICD-10-CM | POA: Diagnosis not present

## 2024-03-28 ENCOUNTER — Ambulatory Visit: Admitting: Internal Medicine

## 2024-04-21 DIAGNOSIS — F413 Other mixed anxiety disorders: Secondary | ICD-10-CM | POA: Diagnosis not present

## 2024-04-21 DIAGNOSIS — F1010 Alcohol abuse, uncomplicated: Secondary | ICD-10-CM | POA: Diagnosis not present

## 2024-04-21 DIAGNOSIS — F331 Major depressive disorder, recurrent, moderate: Secondary | ICD-10-CM | POA: Diagnosis not present

## 2024-05-05 ENCOUNTER — Ambulatory Visit (HOSPITAL_COMMUNITY): Admission: EM | Admit: 2024-05-05 | Discharge: 2024-05-05 | Disposition: A | Discharge status: Home / Self Care

## 2024-05-05 DIAGNOSIS — Z638 Other specified problems related to primary support group: Secondary | ICD-10-CM | POA: Diagnosis not present

## 2024-05-05 DIAGNOSIS — Z5900 Homelessness unspecified: Secondary | ICD-10-CM

## 2024-05-05 NOTE — Discharge Instructions (Signed)
 Follow-up with the main shelter.

## 2024-05-05 NOTE — ED Provider Notes (Signed)
 [facility] Medical Screening Exam  Patient Name: Kyle Webb MRN: [id number] Date of Evaluation: 05/06/24 Chief Complaint:  got kick out by his parent today Diagnosis:  Final diagnoses:  Family discord  Homeless    History of Present illness: Kyle Webb is a 28 y.o. male. With a history of alcohol  abuse, polysubstance abuse, MDD, presented to [facility] voluntarily.  Per the patient he got kicked out of his parents house and he has nowhere to go tonight.  Writer discussed with patient that he needs to reach out to the shelters for assistance with housing.  According to the patient he is prescribed medications and he is compliant with his medicines.  Per the patient he got into an argument with his family because of his behavior and so they kicked him out.   Face-to-face observation of patient, patient is alert and oriented x 4, speech is clear maintain eye contact.  Patient denies SI, HI, AVH or paranoia.  Reports that he drinks alcohol  a few days ago.  Patient denies smoking, also denies illicit drug use recently.  Patient denied wanting to hurt himself or others.  At this present moment patient does not seem to be influenced by internal stimuli.  Writer discussed with patient that he needed to reach out to the main shelter for assistance with housing at this time.  Patient is advised to call 911 or return to the nearest ED should he experience suicidal thoughts homicidal ideation or hallucination.  At this present moment patient does not seem to be a risk to himself or others.  Patient was given handout resources to reach out to the shelter for housing assistance.  Coming discharge for patient to follow up with the shelters.  Flowsheet Row ED from 05/05/2024 in [facility] ED from 03/08/2024 in [facility] ED from 02/05/2024 in [facility]  C-SSRS RISK CATEGORY No Risk No Risk Moderate  Risk    Psychiatric Specialty Exam  Presentation  General Appearance:Casual  Eye Contact:Good  Speech:Clear and Coherent  Speech Volume:Normal  Handedness:Right   Mood and Affect  Mood: Euphoric  Affect: Congruent   Thought Process  Thought Processes: Coherent  Descriptions of Associations:Intact  Orientation:Full (Time, Place and Person)  Thought Content:Logical  Diagnosis of Schizophrenia or Schizoaffective disorder in past: No   Hallucinations:None  Ideas of Reference:None  Suicidal Thoughts:No  Homicidal Thoughts:No   Sensorium  Memory: Immediate Fair  Judgment: Fair  Insight: Fair   Art therapist  Concentration: Fair  Attention Span: Fair  Recall: Kyle Webb: Fair  Language: Fair   Psychomotor Activity  Psychomotor Activity: Normal   Assets  Assets: Desire for Improvement; Resilience; Housing   Sleep  Sleep: Fair  Number of hours:  7   Physical Exam: Physical Exam HENT:     Head: Normocephalic.     Nose: Nose normal.  Eyes:     Pupils: Pupils are equal, round, and reactive to light.  Cardiovascular:     Rate and Rhythm: Normal rate.  Pulmonary:     Effort: Pulmonary effort is normal.  Musculoskeletal:        General: Normal range of motion.     Cervical back: Normal range of motion.  Neurological:     General: No focal deficit present.     Mental Status: He is alert.  Psychiatric:        Mood and Affect: Mood normal.  Review of Systems  Constitutional: Negative.   HENT: Negative.    Eyes: Negative.   Respiratory: Negative.    Cardiovascular: Negative.   Gastrointestinal: Negative.   Genitourinary: Negative.   Musculoskeletal: Negative.   Skin: Negative.   Neurological: Negative.   Psychiatric/Behavioral:  The patient is nervous/anxious.    Blood pressure 113/84, pulse 98, temperature 98.6 F (37 C), temperature source Oral, resp. rate 17, SpO2 96%. There is no height  or weight on file to calculate BMI.  Musculoskeletal: Strength & Muscle Tone: within normal limits Gait & Station: normal Patient leans: N/A   [facility] MSE Discharge Disposition for Follow up and Recommendations: Based on my evaluation the patient does not appear to have an emergency medical condition and can be discharged with resources and follow up care in outpatient services for housing   Gaither Pouch, NP 05/06/2024, 5:41 AM

## 2024-05-07 DIAGNOSIS — F330 Major depressive disorder, recurrent, mild: Secondary | ICD-10-CM | POA: Diagnosis not present

## 2024-05-07 DIAGNOSIS — F1020 Alcohol dependence, uncomplicated: Secondary | ICD-10-CM | POA: Diagnosis not present

## 2024-05-09 DIAGNOSIS — F1020 Alcohol dependence, uncomplicated: Secondary | ICD-10-CM | POA: Diagnosis not present

## 2024-05-10 DIAGNOSIS — F1020 Alcohol dependence, uncomplicated: Secondary | ICD-10-CM | POA: Diagnosis not present

## 2024-05-11 DIAGNOSIS — Z79899 Other long term (current) drug therapy: Secondary | ICD-10-CM | POA: Diagnosis not present

## 2024-05-11 DIAGNOSIS — F1020 Alcohol dependence, uncomplicated: Secondary | ICD-10-CM | POA: Diagnosis not present

## 2024-05-11 DIAGNOSIS — E039 Hypothyroidism, unspecified: Secondary | ICD-10-CM | POA: Diagnosis not present

## 2024-05-11 DIAGNOSIS — F330 Major depressive disorder, recurrent, mild: Secondary | ICD-10-CM | POA: Diagnosis not present

## 2024-05-12 DIAGNOSIS — F1020 Alcohol dependence, uncomplicated: Secondary | ICD-10-CM | POA: Diagnosis not present

## 2024-05-13 DIAGNOSIS — F1020 Alcohol dependence, uncomplicated: Secondary | ICD-10-CM | POA: Diagnosis not present

## 2024-05-14 DIAGNOSIS — W010XXA Fall on same level from slipping, tripping and stumbling without subsequent striking against object, initial encounter: Secondary | ICD-10-CM | POA: Diagnosis not present

## 2024-05-14 DIAGNOSIS — Z23 Encounter for immunization: Secondary | ICD-10-CM | POA: Diagnosis not present

## 2024-05-14 DIAGNOSIS — Y9301 Activity, walking, marching and hiking: Secondary | ICD-10-CM | POA: Diagnosis not present

## 2024-05-14 DIAGNOSIS — F1020 Alcohol dependence, uncomplicated: Secondary | ICD-10-CM | POA: Diagnosis not present

## 2024-05-14 DIAGNOSIS — S81811A Laceration without foreign body, right lower leg, initial encounter: Secondary | ICD-10-CM | POA: Diagnosis not present

## 2024-05-16 DIAGNOSIS — F1020 Alcohol dependence, uncomplicated: Secondary | ICD-10-CM | POA: Diagnosis not present

## 2024-05-17 DIAGNOSIS — F1020 Alcohol dependence, uncomplicated: Secondary | ICD-10-CM | POA: Diagnosis not present

## 2024-05-18 DIAGNOSIS — F1020 Alcohol dependence, uncomplicated: Secondary | ICD-10-CM | POA: Diagnosis not present

## 2024-05-19 ENCOUNTER — Telehealth: Payer: Self-pay

## 2024-05-19 DIAGNOSIS — F1020 Alcohol dependence, uncomplicated: Secondary | ICD-10-CM | POA: Diagnosis not present

## 2024-05-19 NOTE — Telephone Encounter (Signed)
 Transition Care Management Follow-up Telephone Call Date of discharge and from where: 05/14/2024 [facility] [place], [place] How have you been since you were released from the hospital? Same Any questions or concerns? No  Items Reviewed: Did the pt receive and understand the discharge instructions provided? N/A Medications obtained and verified? No  Other? No  Any new allergies since your discharge? No  Dietary orders reviewed? No Do you have support at home? Yes   Home Care and Equipment/Supplies: Were home health services ordered? not applicable If so, what is the name of the agency?   Has the agency set up a time to come to the patient's home? not applicable Were any new equipment or medical supplies ordered?  No What is the name of the medical supply agency?  Were you able to get the supplies/equipment? not applicable Do you have any questions related to the use of the equipment or supplies? No  Functional Questionnaire: (I = Independent and D = Dependent) ADLs: I  Bathing/Dressing- I    Meal Prep- I  Eating- I  Maintaining continence- I  Transferring/Ambulation- I  Managing Meds- I  Follow up appointments reviewed:  PCP Hospital f/u appt confirmed? No   Specialist Hospital f/u appt confirmed? No   Are transportation arrangements needed? No; pt currently inpatient at rehab facility If their condition worsens, is the pt aware to call PCP or go to the Emergency Dept.? Yes Was the patient provided with contact information for the PCP's office or ED? Yes Was to pt encouraged to call back with questions or concerns? Yes

## 2024-05-20 DIAGNOSIS — F1020 Alcohol dependence, uncomplicated: Secondary | ICD-10-CM | POA: Diagnosis not present

## 2024-05-21 DIAGNOSIS — F1020 Alcohol dependence, uncomplicated: Secondary | ICD-10-CM | POA: Diagnosis not present

## 2024-05-23 DIAGNOSIS — F1020 Alcohol dependence, uncomplicated: Secondary | ICD-10-CM | POA: Diagnosis not present

## 2024-05-24 DIAGNOSIS — F1020 Alcohol dependence, uncomplicated: Secondary | ICD-10-CM | POA: Diagnosis not present

## 2024-05-27 DIAGNOSIS — F1020 Alcohol dependence, uncomplicated: Secondary | ICD-10-CM | POA: Diagnosis not present

## 2024-05-30 DIAGNOSIS — F1020 Alcohol dependence, uncomplicated: Secondary | ICD-10-CM | POA: Diagnosis not present

## 2024-05-31 DIAGNOSIS — F1020 Alcohol dependence, uncomplicated: Secondary | ICD-10-CM | POA: Diagnosis not present

## 2024-06-01 DIAGNOSIS — F1020 Alcohol dependence, uncomplicated: Secondary | ICD-10-CM | POA: Diagnosis not present

## 2024-06-02 DIAGNOSIS — F1020 Alcohol dependence, uncomplicated: Secondary | ICD-10-CM | POA: Diagnosis not present

## 2024-06-04 DIAGNOSIS — F1020 Alcohol dependence, uncomplicated: Secondary | ICD-10-CM | POA: Diagnosis not present

## 2024-06-06 DIAGNOSIS — F1020 Alcohol dependence, uncomplicated: Secondary | ICD-10-CM | POA: Diagnosis not present

## 2024-06-07 DIAGNOSIS — F1020 Alcohol dependence, uncomplicated: Secondary | ICD-10-CM | POA: Diagnosis not present

## 2024-06-08 DIAGNOSIS — F1020 Alcohol dependence, uncomplicated: Secondary | ICD-10-CM | POA: Diagnosis not present

## 2024-06-10 DIAGNOSIS — F1020 Alcohol dependence, uncomplicated: Secondary | ICD-10-CM | POA: Diagnosis not present

## 2024-06-14 DIAGNOSIS — F1020 Alcohol dependence, uncomplicated: Secondary | ICD-10-CM | POA: Diagnosis not present

## 2024-06-15 DIAGNOSIS — F1020 Alcohol dependence, uncomplicated: Secondary | ICD-10-CM | POA: Diagnosis not present

## 2024-06-21 DIAGNOSIS — F1020 Alcohol dependence, uncomplicated: Secondary | ICD-10-CM | POA: Diagnosis not present

## 2024-06-22 DIAGNOSIS — F1020 Alcohol dependence, uncomplicated: Secondary | ICD-10-CM | POA: Diagnosis not present

## 2024-06-23 DIAGNOSIS — F1020 Alcohol dependence, uncomplicated: Secondary | ICD-10-CM | POA: Diagnosis not present

## 2024-06-24 DIAGNOSIS — F1020 Alcohol dependence, uncomplicated: Secondary | ICD-10-CM | POA: Diagnosis not present

## 2024-06-27 DIAGNOSIS — F1020 Alcohol dependence, uncomplicated: Secondary | ICD-10-CM | POA: Diagnosis not present

## 2024-07-01 DIAGNOSIS — F1020 Alcohol dependence, uncomplicated: Secondary | ICD-10-CM | POA: Diagnosis not present

## 2024-07-04 DIAGNOSIS — F1020 Alcohol dependence, uncomplicated: Secondary | ICD-10-CM | POA: Diagnosis not present

## 2024-07-06 DIAGNOSIS — F1020 Alcohol dependence, uncomplicated: Secondary | ICD-10-CM | POA: Diagnosis not present

## 2024-07-08 DIAGNOSIS — F1020 Alcohol dependence, uncomplicated: Secondary | ICD-10-CM | POA: Diagnosis not present

## 2024-07-11 DIAGNOSIS — F1020 Alcohol dependence, uncomplicated: Secondary | ICD-10-CM | POA: Diagnosis not present

## 2024-07-13 DIAGNOSIS — Z79899 Other long term (current) drug therapy: Secondary | ICD-10-CM | POA: Diagnosis not present

## 2024-07-13 DIAGNOSIS — F1020 Alcohol dependence, uncomplicated: Secondary | ICD-10-CM | POA: Diagnosis not present

## 2024-07-13 DIAGNOSIS — F330 Major depressive disorder, recurrent, mild: Secondary | ICD-10-CM | POA: Diagnosis not present

## 2024-07-15 DIAGNOSIS — F330 Major depressive disorder, recurrent, mild: Secondary | ICD-10-CM | POA: Diagnosis not present

## 2024-07-18 DIAGNOSIS — F330 Major depressive disorder, recurrent, mild: Secondary | ICD-10-CM | POA: Diagnosis not present

## 2024-07-18 DIAGNOSIS — F1020 Alcohol dependence, uncomplicated: Secondary | ICD-10-CM | POA: Diagnosis not present

## 2024-07-25 DIAGNOSIS — F330 Major depressive disorder, recurrent, mild: Secondary | ICD-10-CM | POA: Diagnosis not present

## 2024-07-25 DIAGNOSIS — F1020 Alcohol dependence, uncomplicated: Secondary | ICD-10-CM | POA: Diagnosis not present

## 2024-07-26 DIAGNOSIS — F1020 Alcohol dependence, uncomplicated: Secondary | ICD-10-CM | POA: Diagnosis not present

## 2024-07-27 DIAGNOSIS — F1020 Alcohol dependence, uncomplicated: Secondary | ICD-10-CM | POA: Diagnosis not present

## 2024-07-28 DIAGNOSIS — F1020 Alcohol dependence, uncomplicated: Secondary | ICD-10-CM | POA: Diagnosis not present

## 2024-07-29 DIAGNOSIS — F1020 Alcohol dependence, uncomplicated: Secondary | ICD-10-CM | POA: Diagnosis not present

## 2024-07-30 DIAGNOSIS — F1020 Alcohol dependence, uncomplicated: Secondary | ICD-10-CM | POA: Diagnosis not present

## 2024-08-01 DIAGNOSIS — F1020 Alcohol dependence, uncomplicated: Secondary | ICD-10-CM | POA: Diagnosis not present

## 2024-08-02 DIAGNOSIS — F1020 Alcohol dependence, uncomplicated: Secondary | ICD-10-CM | POA: Diagnosis not present

## 2024-08-03 DIAGNOSIS — F1020 Alcohol dependence, uncomplicated: Secondary | ICD-10-CM | POA: Diagnosis not present

## 2024-08-05 DIAGNOSIS — F1020 Alcohol dependence, uncomplicated: Secondary | ICD-10-CM | POA: Diagnosis not present

## 2024-08-08 DIAGNOSIS — F1020 Alcohol dependence, uncomplicated: Secondary | ICD-10-CM | POA: Diagnosis not present

## 2024-08-09 DIAGNOSIS — F1020 Alcohol dependence, uncomplicated: Secondary | ICD-10-CM | POA: Diagnosis not present

## 2024-08-10 DIAGNOSIS — F1020 Alcohol dependence, uncomplicated: Secondary | ICD-10-CM | POA: Diagnosis not present

## 2024-08-11 DIAGNOSIS — F1020 Alcohol dependence, uncomplicated: Secondary | ICD-10-CM | POA: Diagnosis not present

## 2024-08-12 DIAGNOSIS — F1020 Alcohol dependence, uncomplicated: Secondary | ICD-10-CM | POA: Diagnosis not present

## 2024-08-13 DIAGNOSIS — F1020 Alcohol dependence, uncomplicated: Secondary | ICD-10-CM | POA: Diagnosis not present

## 2024-08-15 DIAGNOSIS — F1020 Alcohol dependence, uncomplicated: Secondary | ICD-10-CM | POA: Diagnosis not present

## 2024-08-16 DIAGNOSIS — F1020 Alcohol dependence, uncomplicated: Secondary | ICD-10-CM | POA: Diagnosis not present

## 2024-08-17 DIAGNOSIS — F1020 Alcohol dependence, uncomplicated: Secondary | ICD-10-CM | POA: Diagnosis not present

## 2024-08-18 DIAGNOSIS — F1020 Alcohol dependence, uncomplicated: Secondary | ICD-10-CM | POA: Diagnosis not present

## 2024-08-19 DIAGNOSIS — F1020 Alcohol dependence, uncomplicated: Secondary | ICD-10-CM | POA: Diagnosis not present

## 2024-08-20 DIAGNOSIS — F1020 Alcohol dependence, uncomplicated: Secondary | ICD-10-CM | POA: Diagnosis not present
